# Patient Record
Sex: Female | Born: 1995
Health system: Southern US, Community
[De-identification: ages and names within clinical notes are randomized; demographics above are authoritative.]

## PROBLEM LIST (undated history)

## (undated) DIAGNOSIS — Z1589 Genetic susceptibility to other disease: Secondary | ICD-10-CM

## (undated) HISTORY — PX: WISDOM TOOTH EXTRACTION: SHX21

## (undated) HISTORY — DX: Genetic susceptibility to other disease: Z15.89

---
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
## 2015-11-04 ENCOUNTER — Encounter: Payer: Self-pay | Admitting: Certified Nurse Midwife

## 2015-11-04 ENCOUNTER — Ambulatory Visit (INDEPENDENT_AMBULATORY_CARE_PROVIDER_SITE_OTHER): Payer: Commercial Managed Care - PPO | Admitting: Certified Nurse Midwife

## 2015-11-04 VITALS — BP 104/70 | HR 68 | Resp 16 | Ht 62.25 in | Wt 109.0 lb

## 2015-11-04 DIAGNOSIS — N6310 Unspecified lump in the right breast, unspecified quadrant: Secondary | ICD-10-CM | POA: Diagnosis not present

## 2015-11-04 DIAGNOSIS — Z3041 Encounter for surveillance of contraceptive pills: Secondary | ICD-10-CM | POA: Diagnosis not present

## 2015-11-04 DIAGNOSIS — Z01419 Encounter for gynecological examination (general) (routine) without abnormal findings: Secondary | ICD-10-CM

## 2015-11-04 DIAGNOSIS — N926 Irregular menstruation, unspecified: Secondary | ICD-10-CM | POA: Diagnosis not present

## 2015-11-04 LAB — POCT URINE PREGNANCY: Preg Test, Ur: NEGATIVE

## 2015-11-04 MED ORDER — NORETHIN ACE-ETH ESTRAD-FE 1-20 MG-MCG PO TABS: ORAL_TABLET | ORAL | 4 refills | Status: DC

## 2015-11-04 MED ORDER — NORETHIN ACE-ETH ESTRAD-FE 1-20 MG-MCG(24) PO TABS: 1.0000 | ORAL_TABLET | Freq: Every day | ORAL | 4 refills | Status: DC

## 2015-11-04 NOTE — Patient Instructions (Signed)
General topics  Next pap or exam is  due in 1 year Take a Women's multivitamin Take 1200 mg. of calcium daily - prefer dietary If any concerns in interim to call back  Breast Self-Awareness Practicing breast self-awareness may pick up problems early, prevent significant medical complications, and possibly save your life. By practicing breast self-awareness, you can become familiar with how your breasts look and feel and if your breasts are changing. This allows you to notice changes early. It can also offer you some reassurance that your breast health is good. One way to learn what is normal for your breasts and whether your breasts are changing is to do a breast self-exam. If you find a lump or something that was not present in the past, it is best to contact your caregiver right away. Other findings that should be evaluated by your caregiver include nipple discharge, especially if it is bloody; skin changes or reddening; areas where the skin seems to be pulled in (retracted); or new lumps and bumps. Breast pain is seldom associated with cancer (malignancy), but should also be evaluated by a caregiver. BREAST SELF-EXAM The best time to examine your breasts is 5 7 days after your menstrual period is over.  ExitCare Patient Information 2013 [employer].   Exercise to Stay Healthy Exercise helps you become and stay healthy. EXERCISE IDEAS AND TIPS Choose exercises that:  You enjoy.  Fit into your day. You do not need to exercise really hard to be healthy. You can do exercises at a slow or medium level and stay healthy. You can:  Stretch before and after working out.  Try yoga, Pilates, or tai chi.  Lift weights.  Walk fast, swim, jog, run, climb stairs, bicycle, dance, or rollerskate.  Take aerobic classes. Exercises that burn about 150 calories:  Running 1  miles in 15 minutes.  Playing volleyball for 45 to 60 minutes.  Washing and waxing a car for 45 to 60  minutes.  Playing touch football for 45 minutes.  Walking 1  miles in 35 minutes.  Pushing a stroller 1  miles in 30 minutes.  Playing basketball for 30 minutes.  Raking leaves for 30 minutes.  Bicycling 5 miles in 30 minutes.  Walking 2 miles in 30 minutes.  Dancing for 30 minutes.  Shoveling snow for 15 minutes.  Swimming laps for 20 minutes.  Walking up stairs for 15 minutes.  Bicycling 4 miles in 15 minutes.  Gardening for 30 to 45 minutes.  Jumping rope for 15 minutes.  Washing windows or floors for 45 to 60 minutes. Document Released: 02/20/2010 Document Revised: 04/12/2011 Document Reviewed: 02/20/2010 ExitCare Patient Information 2013 [employer].   Other topics ( that may be useful information):    Sexually Transmitted Disease Sexually transmitted disease (STD) refers to any infection that is passed from person to person during sexual activity. This may happen by way of saliva, semen, blood, vaginal mucus, or urine. Common STDs include:  Gonorrhea.  Chlamydia.  Syphilis.  HIV/AIDS.  Genital herpes.  Hepatitis B and C.  Trichomonas.  Human papillomavirus (HPV).  Pubic lice. CAUSES  An STD may be spread by bacteria, virus, or parasite. A person can get an STD by:  Sexual intercourse with an infected person.  Sharing sex toys with an infected person.  Sharing needles with an infected person.  Having intimate contact with the genitals, mouth, or rectal areas of an infected person. SYMPTOMS  Some people may not have any symptoms, but   they can still pass the infection to others. Different STDs have different symptoms. Symptoms include:  Painful or bloody urination.  Pain in the pelvis, abdomen, vagina, anus, throat, or eyes.  Skin rash, itching, irritation, growths, or sores (lesions). These usually occur in the genital or anal area.  Abnormal vaginal discharge.  Penile discharge in men.  Soft, flesh-colored skin growths in the  genital or anal area.  Fever.  Pain or bleeding during sexual intercourse.  Swollen glands in the groin area.  Yellow skin and eyes (jaundice). This is seen with hepatitis. DIAGNOSIS  To make a diagnosis, your caregiver may:  Take a medical history.  Perform a physical exam.  Take a specimen (culture) to be examined.  Examine a sample of discharge under a microscope.  Perform blood test TREATMENT   Chlamydia, gonorrhea, trichomonas, and syphilis can be cured with antibiotic medicine.  Genital herpes, hepatitis, and HIV can be treated, but not cured, with prescribed medicines. The medicines will lessen the symptoms.  Genital warts from HPV can be treated with medicine or by freezing, burning (electrocautery), or surgery. Warts may come back.  HPV is a virus and cannot be cured with medicine or surgery.However, abnormal areas may be followed very closely by your caregiver and may be removed from the cervix, vagina, or vulva through office procedures or surgery. If your diagnosis is confirmed, your recent sexual partners need treatment. This is true even if they are symptom-free or have a negative culture or evaluation. They should not have sex until their caregiver says it is okay. HOME CARE INSTRUCTIONS  All sexual partners should be informed, tested, and treated for all STDs.  Take your antibiotics as directed. Finish them even if you start to feel better.  Only take over-the-counter or prescription medicines for pain, discomfort, or fever as directed by your caregiver.  Rest.  Eat a balanced diet and drink enough fluids to keep your urine clear or pale yellow.  Do not have sex until treatment is completed and you have followed up with your caregiver. STDs should be checked after treatment.  Keep all follow-up appointments, Pap tests, and blood tests as directed by your caregiver.  Only use latex condoms and water-soluble lubricants during sexual activity. Do not use  petroleum jelly or oils.  Avoid alcohol and illegal drugs.  Get vaccinated for HPV and hepatitis. If you have not received these vaccines in the past, talk to your caregiver about whether one or both might be right for you.  Avoid risky sex practices that can break the skin. The only way to avoid getting an STD is to avoid all sexual activity.Latex condoms and dental dams (for oral sex) will help lessen the risk of getting an STD, but will not completely eliminate the risk. SEEK MEDICAL CARE IF:   You have a fever.  You have any new or worsening symptoms. Document Released: 04/10/2002 Document Revised: 04/12/2011 Document Reviewed: 04/17/2010 [facility] Patient Information 2013 [employer].    Domestic Abuse You are being battered or abused if someone close to you hits, pushes, or physically hurts you in any way. You also are being abused if you are forced into activities. You are being sexually abused if you are forced to have sexual contact of any kind. You are being emotionally abused if you are made to feel worthless or if you are constantly threatened. It is important to remember that help is available. No one has the right to abuse you. PREVENTION OF FURTHER  ABUSE  Learn the warning signs of danger. This varies with situations but may include: the use of alcohol, threats, isolation from friends and family, or forced sexual contact. Leave if you feel that violence is going to occur.  If you are attacked or beaten, report it to the police so the abuse is documented. You do not have to press charges. The police can protect you while you or the attackers are leaving. Get the officer's name and badge number and a copy of the report.  Find someone you can trust and tell them what is happening to you: your caregiver, a nurse, clergy member, close friend or family member. Feeling ashamed is natural, but remember that you have done nothing wrong. No one deserves abuse. Document Released:  01/16/2000 Document Revised: 04/12/2011 Document Reviewed: 03/26/2010 ExitCare Patient Information 2013 [employer].    How Much is Too Much Alcohol? Drinking too much alcohol can cause injury, accidents, and health problems. These types of problems can include:   Car crashes.  Falls.  Family fighting (domestic violence).  Drowning.  Fights.  Injuries.  Burns.  Damage to certain organs.  Having a baby with birth defects. ONE DRINK CAN BE TOO MUCH WHEN YOU ARE:  Working.  Pregnant or breastfeeding.  Taking medicines. Ask your doctor.  Driving or planning to drive. If you or someone you know has a drinking problem, get help from a doctor.  Document Released: 11/14/2008 Document Revised: 04/12/2011 Document Reviewed: 11/14/2008 ExitCare Patient Information 2013 [employer].   Smoking Hazards Smoking cigarettes is extremely bad for your health. Tobacco smoke has over 200 known poisons in it. There are over 60 chemicals in tobacco smoke that cause cancer. Some of the chemicals found in cigarette smoke include:   Cyanide.  Benzene.  Formaldehyde.  Methanol (wood alcohol).  Acetylene (fuel used in welding torches).  Ammonia. Cigarette smoke also contains the poisonous gases nitrogen oxide and carbon monoxide.  Cigarette smokers have an increased risk of many serious medical problems and Smoking causes approximately:  90% of all lung cancer deaths in men.  80% of all lung cancer deaths in women.  90% of deaths from chronic obstructive lung disease. Compared with nonsmokers, smoking increases the risk of:  Coronary heart disease by 2 to 4 times.  Stroke by 2 to 4 times.  Men developing lung cancer by 23 times.  Women developing lung cancer by 13 times.  Dying from chronic obstructive lung diseases by 12 times.  . Smoking is the most preventable cause of death and disease in our society.  WHY IS SMOKING ADDICTIVE?  Nicotine is the chemical  agent in tobacco that is capable of causing addiction or dependence.  When you smoke and inhale, nicotine is absorbed rapidly into the bloodstream through your lungs. Nicotine absorbed through the lungs is capable of creating a powerful addiction. Both inhaled and non-inhaled nicotine may be addictive.  Addiction studies of cigarettes and spit tobacco show that addiction to nicotine occurs mainly during the teen years, when young people begin using tobacco products. WHAT ARE THE BENEFITS OF QUITTING?  There are many health benefits to quitting smoking.   Likelihood of developing cancer and heart disease decreases. Health improvements are seen almost immediately.  Blood pressure, pulse rate, and breathing patterns start returning to normal soon after quitting. QUITTING SMOKING   American Lung Association - 1-800-LUNGUSA  American Cancer Society - 1-800-ACS-2345 Document Released: 02/26/2004 Document Revised: 04/12/2011 Document Reviewed: 10/30/2008 ExitCare Patient Information 2013 ExitCare,   LLC.   Stress Management Stress is a state of physical or mental tension that often results from changes in your life or normal routine. Some common causes of stress are:  Death of a loved one.  Injuries or severe illnesses.  Getting fired or changing jobs.  Moving into a new home. Other causes may be:  Sexual problems.  Business or financial losses.  Taking on a large debt.  Regular conflict with someone at home or at work.  Constant tiredness from lack of sleep. It is not just bad things that are stressful. It may be stressful to:  Win the lottery.  Get married.  Buy a new car. The amount of stress that can be easily tolerated varies from person to person. Changes generally cause stress, regardless of the types of change. Too much stress can affect your health. It may lead to physical or emotional problems. Too little stress (boredom) may also become stressful. SUGGESTIONS TO  REDUCE STRESS:  Talk things over with your family and friends. It often is helpful to share your concerns and worries. If you feel your problem is serious, you may want to get help from a professional counselor.  Consider your problems one at a time instead of lumping them all together. Trying to take care of everything at once may seem impossible. List all the things you need to do and then start with the most important one. Set a goal to accomplish 2 or 3 things each day. If you expect to do too many in a single day you will naturally fail, causing you to feel even more stressed.  Do not use alcohol or drugs to relieve stress. Although you may feel better for a short time, they do not remove the problems that caused the stress. They can also be habit forming.  Exercise regularly - at least 3 times per week. Physical exercise can help to relieve that "uptight" feeling and will relax you.  The shortest distance between despair and hope is often a good night's sleep.  Go to bed and get up on time allowing yourself time for appointments without being rushed.  Take a short "time-out" period from any stressful situation that occurs during the day. Close your eyes and take some deep breaths. Starting with the muscles in your face, tense them, hold it for a few seconds, then relax. Repeat this with the muscles in your neck, shoulders, hand, stomach, back and legs.  Take good care of yourself. Eat a balanced diet and get plenty of rest.  Schedule time for having fun. Take a break from your daily routine to relax. HOME CARE INSTRUCTIONS   Call if you feel overwhelmed by your problems and feel you can no longer manage them on your own.  Return immediately if you feel like hurting yourself or someone else. Document Released: 07/14/2000 Document Revised: 04/12/2011 Document Reviewed: 03/06/2007 ExitCare Patient Information 2013 ExitCare[employer].   

## 2015-11-04 NOTE — Progress Notes (Signed)
Scheduled patient while in office for right breast ultrasound at the [facility] on 11/13/2015 at 9 am. Patient is agreeable to date and time. Placed in mammogram hold.

## 2015-11-04 NOTE — Progress Notes (Signed)
20 y.o. G0P0000 Single  Caucasian Fe here for annual exam. Contraception OCP. Periods normal, no issues. No partner change, no STD concerns or screening needed. Patient feels her OCP's are contributing to vaginal dryness on her off week. Would like to change but this dosage has worked well. No other health concerns today. Majoring in Speech pathology !  Patient's last menstrual period was 11/01/2015 (exact date).          Sexually active: Yes.    The current method of family planning is OCP (estrogen/progesterone).    Exercising: Yes.    cardio & weights Smoker:  no  Health Maintenance: Pap:  none MMG:  none Colonoscopy:  none BMD:   none TDaP:  2015 Shingles: no Pneumonia: no Hep C and HIV: not done Labs: upt-neg Self breast exam: not done   reports that she has never smoked. She has never used smokeless tobacco. She reports that she drinks about 3.0 oz of alcohol per week . She reports that she does not use drugs.  History reviewed. No pertinent past medical history.  History reviewed. No pertinent surgical history.  Current Outpatient Prescriptions  Medication Sig Dispense Refill  . norethindrone-ethinyl estradiol (MICROGESTIN FE 1/20) 1-20 MG-MCG tablet TAKE ONE TABLET BY MOUTH EVERY DAY 3 Package 4   No current facility-administered medications for this visit.     Family History  Problem Relation Age of Onset  . Diabetes Father   . Heart attack Father   . Hyperlipidemia Father   . Hypertension Father   . Hypertension Maternal Grandmother   . Hyperlipidemia Maternal Grandmother   . Diabetes Maternal Grandmother     ROS:  Pertinent items are noted in HPI.  Otherwise, a comprehensive ROS was negative.  Exam:   BP 104/70   Pulse 68   Resp 16   Ht 5' 2.25" (1.581 m)   Wt 109 lb (49.4 kg)   LMP 11/01/2015 (Exact Date)   BMI 19.78 kg/m  Height: 5' 2.25" (158.1 cm) Ht Readings from Last 3 Encounters:  11/04/15 5' 2.25" (1.581 m)  09/27/14 5' 1.75" (1.568 m) (16  %, Z= -0.99)*  09/14/13 5\' 2"  (1.575 m) (19 %, Z= -0.88)*   * Growth percentiles are based on CDC 2-20 Years data.    General appearance: alert, cooperative and appears stated age Head: Normocephalic, without obvious abnormality, atraumatic Neck: no adenopathy, supple, symmetrical, trachea midline and thyroid normal to inspection and palpation Lungs: clear to auscultation bilaterally Breasts: normal appearance, no masses or tenderness, No nipple retraction or dimpling, No nipple discharge or bleeding, No axillary or supraclavicular adenopathy, positive findings: right breast at 9 o'clock position mobile pea size cluster, non tender, no skin change or nipple discharge @ aerola edge Heart: regular rate and rhythm Abdomen: soft, non-tender; no masses,  no organomegaly Extremities: extremities normal, atraumatic, no cyanosis or edema Skin: Skin color, texture, turgor normal. No rashes or lesions Lymph nodes: Cervical, supraclavicular, and axillary nodes normal. No abnormal inguinal nodes palpated Neurologic: Grossly normal   Pelvic: External genitalia:  no lesions              Urethra:  normal appearing urethra with no masses, tenderness or lesions              Bartholin's and Skene's: normal                 Vagina: normal appearing vagina with normal color and discharge, no lesions  Cervix: no cervical motion tenderness, no lesions and nulliparous appearance              Pap taken: No. Bimanual Exam:  Uterus:  normal size, contour, position, consistency, mobility, non-tender              Adnexa: normal adnexa and no mass, fullness, tenderness               Rectovaginal: Confirms               Anus:  normal  Chaperone present: yes  A:  Well Woman with normal exam  Contraception OCP change desired due to vaginal dryness  Right breast mass.  P:   Reviewed health and wellness pertinent to exam  Discussed increasing Estrogen length in OC cycle may help without changing  dosage(which the patient would prefer). Patient agreeable and will start once she finishes this pack. Discussed may change period profile and patient OK with that. Also discussed coconut oil use for vaginal moisture with sexual activity. Questions addressed. Will advise if no change.  Rx Loestrin 24 Fe see order with instructions  Discussed right breast finding and patient also palpated area in breast. Discussed need to evaluate with [place]. Discussed possible etiology of cyst period related, or fibroadenoma. Questions addressed. Patient will be scheduled prior to leaving today.  Pap smear as above not taken   counseled on breast self exam, STD prevention, HIV risk factors and prevention, use and side effects of OCP's, adequate intake of calcium and vitamin D, diet and exercise  return annually or prn  An After Visit Summary was printed and given to the patient.

## 2015-11-05 NOTE — Progress Notes (Signed)
Encounter reviewed Randye Treichler, MD   

## 2015-11-13 ENCOUNTER — Ambulatory Visit
Admission: RE | Admit: 2015-11-13 | Discharge: 2015-11-13 | Disposition: A | Discharge status: Home / Self Care | Payer: Commercial Managed Care - PPO | Source: Ambulatory Visit | Attending: Certified Nurse Midwife | Admitting: Certified Nurse Midwife

## 2015-11-13 DIAGNOSIS — N6310 Unspecified lump in the right breast, unspecified quadrant: Secondary | ICD-10-CM

## 2015-11-14 ENCOUNTER — Telehealth: Payer: Self-pay

## 2015-11-14 NOTE — Telephone Encounter (Signed)
Left message to call [facility] at [phone]. Patient needs a 6 week recheck appointment with [name] CNM.  Notes Recorded by Jasmine Awe, RN on 11/14/2015 at 10:45 AM EDT Continued mammogram hold. ------  Notes Recorded by Salvadore Dom, MD on 11/13/2015 at 12:46 PM EDT Keep on mammogram hold until her f/u imaging in 6 months and make sure she has a f/u appointment with Jackelyn Poling for a breast check 6 weeks after her last exam.

## 2015-11-14 NOTE — Telephone Encounter (Signed)
Spoke with patient. 6 week breast recheck appointment scheduled for 12/17/2015 at 2:45 pm with [name] CNM. Patient is agreeable to date and time.  Routing to provider for final review. Patient agreeable to disposition. Will close encounter.

## 2015-12-17 ENCOUNTER — Ambulatory Visit (INDEPENDENT_AMBULATORY_CARE_PROVIDER_SITE_OTHER): Payer: Commercial Managed Care - PPO | Admitting: Certified Nurse Midwife

## 2015-12-17 ENCOUNTER — Encounter: Payer: Self-pay | Admitting: Certified Nurse Midwife

## 2015-12-17 VITALS — BP 104/70 | HR 68 | Resp 16 | Ht 62.25 in | Wt 113.0 lb

## 2015-12-17 DIAGNOSIS — D241 Benign neoplasm of right breast: Secondary | ICD-10-CM

## 2015-12-17 NOTE — Progress Notes (Signed)
   Subjective:   20 y.o. Single Caucasian female presents for recheck of right breast mass. Patient was noted to have right breast mass at aex on 11/04/15. She was seen for [place] on 11/13/15. Findings were probable fibroadenoma, benign appearance and follow up exam in 6 months. Patient does not feel it has changed in size, non tender. Does SBE. No health issues today.  Review of Systems Pertinent items are noted in HPI.   Objective:   General appearance: alert, cooperative, appears stated age and no distress Breasts: normal appearance, no masses or tenderness, No nipple retraction or dimpling, No nipple discharge or bleeding, No axillary or supraclavicular adenopathy, positive findings: right breast at 8-9 o'clock 2 cm mobile breast mass, non tender noted which corresponds to previous finding and [place] report.  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 Plan:   PLAN: The patient has a documented plan to follow up  breast mass with follow up US in 6 months. Patient instructed to do monthly SBE and to advise if she feels there is a change in size or tenderness. Agreeable  Rv prn/aex

## 2015-12-17 NOTE — Patient Instructions (Signed)
Breast Self-Awareness Introduction Breast self-awareness means:  Knowing how your breasts look.  Knowing how your breasts feel.  Checking your breasts every month for changes.  Telling your doctor if you notice a change in your breasts. Breast self-awareness allows you to notice a breast problem early while it is still small. How to do a breast self-exam One way to learn what is normal for your breasts and to check for changes is to do a breast self-exam. To do a breast self-exam: Look for Changes  1. Take off all the clothes above your waist. 2. Stand in front of a mirror in a room with good lighting. 3. Put your hands on your hips. 4. Push your hands down. 5. Look at your breasts and nipples in the mirror to see if one breast or nipple looks different than the other. Check to see if:  The shape of one breast is different.  The size of one breast is different.  There are wrinkles, dips, and bumps in one breast and not the other. 6. Look at each breast for changes in your skin, such as:  Redness.  Scaly areas. 7. Look for changes in your nipples, such as:  Liquid around the nipples.  Bleeding.  Dimpling.  Redness.  A change in where the nipples are. Feel for Changes 1. Lie on your back on the floor. 2. Feel each breast. To do this, follow these steps:  Pick a breast to feel.  Put the arm closest to that breast above your head.  Use your other arm to feel the nipple area of your breast. Feel the area with the pads of your three middle fingers by making small circles with your fingers. For the first circle, press lightly. For the second circle, press harder. For the third circle, press even harder.  Keep making circles with your fingers at the light, harder, and even harder pressures as you move down your breast. Stop when you feel your ribs.  Move your fingers a little toward the center of your body.  Start making circles with your fingers again, this time going  up until you reach your collarbone.  Keep making up and down circles until you reach your armpit. Remember to keep using the three pressures.  Feel the other breast in the same way. 3. Sit or stand in the shower or tub. 4. With soapy water on your skin, feel each breast the same way you did in step 2, when you were lying on the floor. Write Down What You Find  After doing the self-exam, write down:  What is normal for each breast.  Any changes you find in each breast.  When you last had your period. How often should I check my breasts? Check your breasts every month. If you are breastfeeding, the best time to check them is after you feed your baby or after you use a breast pump. If you get periods, the best time to check your breasts is 5-7 days after your period is over. When should I see my doctor? See your doctor if you notice:  A change in shape or size of your breasts or nipples.  A change in the skin of your breast or nipples, such as red or scaly skin.  Unusual fluid coming from your nipples.  A lump or thick area that was not there before.  Pain in your breasts.  Anything that concerns you. This information is not intended to replace advice given to you by  your health care provider. Make sure you discuss any questions you have with your health care provider. Document Released: 07/07/2007 Document Revised: 06/26/2015 Document Reviewed: 12/08/2014  2017 [employer]

## 2015-12-23 NOTE — Progress Notes (Signed)
Encounter reviewed Sahiba Granholm, MD   

## 2016-12-14 ENCOUNTER — Ambulatory Visit: Payer: PRIVATE HEALTH INSURANCE | Admitting: Certified Nurse Midwife

## 2016-12-14 ENCOUNTER — Encounter: Payer: Self-pay | Admitting: Certified Nurse Midwife

## 2016-12-14 ENCOUNTER — Other Ambulatory Visit (HOSPITAL_COMMUNITY)
Admission: RE | Admit: 2016-12-14 | Discharge: 2016-12-14 | Disposition: A | Discharge status: Home / Self Care | Payer: PRIVATE HEALTH INSURANCE | Source: Ambulatory Visit | Attending: Obstetrics & Gynecology | Admitting: Obstetrics & Gynecology

## 2016-12-14 ENCOUNTER — Other Ambulatory Visit: Payer: Self-pay

## 2016-12-14 VITALS — BP 112/72 | HR 70 | Resp 16 | Ht 61.75 in | Wt 123.0 lb

## 2016-12-14 DIAGNOSIS — Z124 Encounter for screening for malignant neoplasm of cervix: Secondary | ICD-10-CM | POA: Insufficient documentation

## 2016-12-14 DIAGNOSIS — Z01419 Encounter for gynecological examination (general) (routine) without abnormal findings: Secondary | ICD-10-CM | POA: Diagnosis not present

## 2016-12-14 DIAGNOSIS — Z3041 Encounter for surveillance of contraceptive pills: Secondary | ICD-10-CM | POA: Diagnosis not present

## 2016-12-14 DIAGNOSIS — D241 Benign neoplasm of right breast: Secondary | ICD-10-CM | POA: Diagnosis not present

## 2016-12-14 MED ORDER — NORETHIN ACE-ETH ESTRAD-FE 1-20 MG-MCG(24) PO TABS: 1.0000 | ORAL_TABLET | Freq: Every day | ORAL | 4 refills | Status: DC

## 2016-12-14 NOTE — Progress Notes (Signed)
21 y.o. G0P0000 Single  Caucasian Fe here for annual exam. Periods normal no issues.Contraception working well, desires continuance. No partner change,no STD concerns. Last year of college, will graduate in spring and then plans graduate school. Working as a Surveyor, minerals to help with expenses. 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 No other health issues today. May not be able to continue here due to change in insurance, will advise. Mother patient here and may have same issues. Took vacation at [employer] !  Patient's last menstrual period was 12/05/2016 (exact date).          Sexually active: Yes.    The current method of family planning is OCP (estrogen/progesterone).    Exercising: Yes.    walk Smoker:  no  Health Maintenance: Pap:  none History of Abnormal Pap: no MMG:  11-13-15 rt breast u/s birads 3: prob benign short interval f/u suggested Self Breast exams: yes Colonoscopy:  none BMD:   none TDaP:  2015 Shingles: no Pneumonia: no Hep C and HIV: not done Labs: if needed.   reports that  has never smoked. she has never used smokeless tobacco. She reports that she drinks about 1.8 oz of alcohol per week. She reports that she does not use drugs.  History reviewed. No pertinent past medical history.  History reviewed. No pertinent surgical history.  Current Outpatient Medications  Medication Sig Dispense Refill  . Norethindrone Acetate-Ethinyl Estrad-FE (LOESTRIN 24 FE) 1-20 MG-MCG(24) tablet Take 1 tablet by mouth daily. 3 Package 4   No current facility-administered medications for this visit.     Family History  Problem Relation Age of Onset  . Diabetes Father   . Heart attack Father   . Hyperlipidemia Father   . Hypertension Father   . Hypertension Maternal Grandmother   . Hyperlipidemia Maternal Grandmother   . Diabetes Maternal Grandmother     ROS:  Pertinent items are noted in HPI.  Otherwise, a comprehensive ROS  was negative.  Exam:   BP 112/72   Pulse 70   Resp 16   Ht 5' 1.75" (1.568 m)   Wt 123 lb (55.8 kg)   LMP 12/05/2016 (Exact Date)   BMI 22.68 kg/m  Height: 5' 1.75" (156.8 cm) Ht Readings from Last 3 Encounters:  12/14/16 5' 1.75" (1.568 m)  12/17/15 5' 2.25" (1.581 m)  11/04/15 5' 2.25" (1.581 m)    General appearance: alert, cooperative and appears stated age Head: Normocephalic, without obvious abnormality, atraumatic Neck: no adenopathy, supple, symmetrical, trachea midline and thyroid normal to inspection and palpation Lungs: clear to auscultation bilaterally Breasts: normal appearance, no masses or tenderness, No nipple retraction or dimpling, No nipple discharge or bleeding, No axillary or supraclavicular adenopathy. Right breast at 9-10 o'clock fibroadenoma noted Heart: regular rate and rhythm Abdomen: soft, non-tender; no masses,  no organomegaly Extremities: extremities normal, atraumatic, no cyanosis or edema Skin: Skin color, texture, turgor normal. No rashes or lesions Lymph nodes: Cervical, supraclavicular, and axillary nodes normal. No abnormal inguinal nodes palpated Neurologic: Grossly normal   Pelvic: External genitalia:  no lesions              Urethra:  normal appearing urethra with no masses, tenderness or lesions              Bartholin's and Skene's: normal                 Vagina: normal appearing vagina with  normal color and discharge, no lesions              Cervix: no cervical motion tenderness, no lesions and nulliparous appearance              Pap taken: Yes.   Bimanual Exam:  Uterus:  normal size, contour, position, consistency, mobility, non-tender              Adnexa: normal adnexa and no mass, fullness, tenderness               Rectovaginal: Confirms               Anus:  normal sphincter tone, no lesions  Chaperone present: yes  A:  Well Woman with normal exam  Contraception OCP desired  History of benign finding of fibroadenoma of right  breast , no change with exam  First pap smear today  P:   Reviewed health and wellness pertinent to exam  Rx Loestrin 24 Fe see order with instructions  Stressed SBE and need to come in if any change  Pap smear: yes   counseled on breast self exam, STD prevention, HIV risk factors and prevention, use and side effects of OCP's, adequate intake of calcium and vitamin D, diet and exercise  return annually or prn  An After Visit Summary was printed and given to the patient.

## 2016-12-14 NOTE — Patient Instructions (Signed)
General topics  Next pap or exam is  due in 1 year Take a Women's multivitamin Take 1200 mg. of calcium daily - prefer dietary If any concerns in interim to call back  Breast Self-Awareness Practicing breast self-awareness may pick up problems early, prevent significant medical complications, and possibly save your life. By practicing breast self-awareness, you can become familiar with how your breasts look and feel and if your breasts are changing. This allows you to notice changes early. It can also offer you some reassurance that your breast health is good. One way to learn what is normal for your breasts and whether your breasts are changing is to do a breast self-exam. If you find a lump or something that was not present in the past, it is best to contact your caregiver right away. Other findings that should be evaluated by your caregiver include nipple discharge, especially if it is bloody; skin changes or reddening; areas where the skin seems to be pulled in (retracted); or new lumps and bumps. Breast pain is seldom associated with cancer (malignancy), but should also be evaluated by a caregiver. BREAST SELF-EXAM The best time to examine your breasts is 5 7 days after your menstrual period is over.  ExitCare Patient Information 2013 [employer].   Exercise to Stay Healthy Exercise helps you become and stay healthy. EXERCISE IDEAS AND TIPS Choose exercises that:  You enjoy.  Fit into your day. You do not need to exercise really hard to be healthy. You can do exercises at a slow or medium level and stay healthy. You can:  Stretch before and after working out.  Try yoga, Pilates, or tai chi.  Lift weights.  Walk fast, swim, jog, run, climb stairs, bicycle, dance, or rollerskate.  Take aerobic classes. Exercises that burn about 150 calories:  Running 1  miles in 15 minutes.  Playing volleyball for 45 to 60 minutes.  Washing and waxing a car for 45 to 60  minutes.  Playing touch football for 45 minutes.  Walking 1  miles in 35 minutes.  Pushing a stroller 1  miles in 30 minutes.  Playing basketball for 30 minutes.  Raking leaves for 30 minutes.  Bicycling 5 miles in 30 minutes.  Walking 2 miles in 30 minutes.  Dancing for 30 minutes.  Shoveling snow for 15 minutes.  Swimming laps for 20 minutes.  Walking up stairs for 15 minutes.  Bicycling 4 miles in 15 minutes.  Gardening for 30 to 45 minutes.  Jumping rope for 15 minutes.  Washing windows or floors for 45 to 60 minutes. Document Released: 02/20/2010 Document Revised: 04/12/2011 Document Reviewed: 02/20/2010 Exit[employer] Patient Information 2013 [employer].   Other topics ( that may be useful information):    Sexually Transmitted Disease Sexually transmitted disease (STD) refers to any infection that is passed from person to person during sexual activity. This may happen by way of saliva, semen, blood, vaginal mucus, or urine. Common STDs include:  Gonorrhea.  Chlamydia.  Syphilis.  HIV/AIDS.  Genital herpes.  Hepatitis B and C.  Trichomonas.  Human papillomavirus (HPV).  Pubic lice. CAUSES  An STD may be spread by bacteria, virus, or parasite. A person can get an STD by:  Sexual intercourse with an infected person.  Sharing sex toys with an infected person.  Sharing needles with an infected person.  Having intimate contact with the genitals, mouth, or rectal areas of an infected person. SYMPTOMS  Some people may not have any symptoms, but   they can still pass the infection to others. Different STDs have different symptoms. Symptoms include:  Painful or bloody urination.  Pain in the pelvis, abdomen, vagina, anus, throat, or eyes.  Skin rash, itching, irritation, growths, or sores (lesions). These usually occur in the genital or anal area.  Abnormal vaginal discharge.  Penile discharge in men.  Soft, flesh-colored skin growths in the  genital or anal area.  Fever.  Pain or bleeding during sexual intercourse.  Swollen glands in the groin area.  Yellow skin and eyes (jaundice). This is seen with hepatitis. DIAGNOSIS  To make a diagnosis, your caregiver may:  Take a medical history.  Perform a physical exam.  Take a specimen (culture) to be examined.  Examine a sample of discharge under a microscope.  Perform blood test TREATMENT   Chlamydia, gonorrhea, trichomonas, and syphilis can be cured with antibiotic medicine.  Genital herpes, hepatitis, and HIV can be treated, but not cured, with prescribed medicines. The medicines will lessen the symptoms.  Genital warts from HPV can be treated with medicine or by freezing, burning (electrocautery), or surgery. Warts may come back.  HPV is a virus and cannot be cured with medicine or surgery.However, abnormal areas may be followed very closely by your caregiver and may be removed from the cervix, vagina, or vulva through office procedures or surgery. If your diagnosis is confirmed, your recent sexual partners need treatment. This is true even if they are symptom-free or have a negative culture or evaluation. They should not have sex until their caregiver says it is okay. HOME CARE INSTRUCTIONS  All sexual partners should be informed, tested, and treated for all STDs.  Take your antibiotics as directed. Finish them even if you start to feel better.  Only take over-the-counter or prescription medicines for pain, discomfort, or fever as directed by your caregiver.  Rest.  Eat a balanced diet and drink enough fluids to keep your urine clear or pale yellow.  Do not have sex until treatment is completed and you have followed up with your caregiver. STDs should be checked after treatment.  Keep all follow-up appointments, Pap tests, and blood tests as directed by your caregiver.  Only use latex condoms and water-soluble lubricants during sexual activity. Do not use  petroleum jelly or oils.  Avoid alcohol and illegal drugs.  Get vaccinated for HPV and hepatitis. If you have not received these vaccines in the past, talk to your caregiver about whether one or both might be right for you.  Avoid risky sex practices that can break the skin. The only way to avoid getting an STD is to avoid all sexual activity.Latex condoms and dental dams (for oral sex) will help lessen the risk of getting an STD, but will not completely eliminate the risk. SEEK MEDICAL CARE IF:   You have a fever.  You have any new or worsening symptoms. Document Released: 04/10/2002 Document Revised: 04/12/2011 Document Reviewed: 04/17/2010 [facility] Patient Information 2013 [employer].    Domestic Abuse You are being battered or abused if someone close to you hits, pushes, or physically hurts you in any way. You also are being abused if you are forced into activities. You are being sexually abused if you are forced to have sexual contact of any kind. You are being emotionally abused if you are made to feel worthless or if you are constantly threatened. It is important to remember that help is available. No one has the right to abuse you. PREVENTION OF FURTHER  ABUSE  Learn the warning signs of danger. This varies with situations but may include: the use of alcohol, threats, isolation from friends and family, or forced sexual contact. Leave if you feel that violence is going to occur.  If you are attacked or beaten, report it to the police so the abuse is documented. You do not have to press charges. The police can protect you while you or the attackers are leaving. Get the officer's name and badge number and a copy of the report.  Find someone you can trust and tell them what is happening to you: your caregiver, a nurse, clergy member, close friend or family member. Feeling ashamed is natural, but remember that you have done nothing wrong. No one deserves abuse. Document Released:  01/16/2000 Document Revised: 04/12/2011 Document Reviewed: 03/26/2010 ExitCare Patient Information 2013 [employer].    How Much is Too Much Alcohol? Drinking too much alcohol can cause injury, accidents, and health problems. These types of problems can include:   Car crashes.  Falls.  Family fighting (domestic violence).  Drowning.  Fights.  Injuries.  Burns.  Damage to certain organs.  Having a baby with birth defects. ONE DRINK CAN BE TOO MUCH WHEN YOU ARE:  Working.  Pregnant or breastfeeding.  Taking medicines. Ask your doctor.  Driving or planning to drive. If you or someone you know has a drinking problem, get help from a doctor.  Document Released: 11/14/2008 Document Revised: 04/12/2011 Document Reviewed: 11/14/2008 ExitCare Patient Information 2013 [employer].   Smoking Hazards Smoking cigarettes is extremely bad for your health. Tobacco smoke has over 200 known poisons in it. There are over 60 chemicals in tobacco smoke that cause cancer. Some of the chemicals found in cigarette smoke include:   Cyanide.  Benzene.  Formaldehyde.  Methanol (wood alcohol).  Acetylene (fuel used in welding torches).  Ammonia. Cigarette smoke also contains the poisonous gases nitrogen oxide and carbon monoxide.  Cigarette smokers have an increased risk of many serious medical problems and Smoking causes approximately:  90% of all lung cancer deaths in men.  80% of all lung cancer deaths in women.  90% of deaths from chronic obstructive lung disease. Compared with nonsmokers, smoking increases the risk of:  Coronary heart disease by 2 to 4 times.  Stroke by 2 to 4 times.  Men developing lung cancer by 23 times.  Women developing lung cancer by 13 times.  Dying from chronic obstructive lung diseases by 12 times.  . Smoking is the most preventable cause of death and disease in our society.  WHY IS SMOKING ADDICTIVE?  Nicotine is the chemical  agent in tobacco that is capable of causing addiction or dependence.  When you smoke and inhale, nicotine is absorbed rapidly into the bloodstream through your lungs. Nicotine absorbed through the lungs is capable of creating a powerful addiction. Both inhaled and non-inhaled nicotine may be addictive.  Addiction studies of cigarettes and spit tobacco show that addiction to nicotine occurs mainly during the teen years, when young people begin using tobacco products. WHAT ARE THE BENEFITS OF QUITTING?  There are many health benefits to quitting smoking.   Likelihood of developing cancer and heart disease decreases. Health improvements are seen almost immediately.  Blood pressure, pulse rate, and breathing patterns start returning to normal soon after quitting. QUITTING SMOKING   American Lung Association - 1-800-LUNGUSA  American Cancer Society - 1-800-ACS-2345 Document Released: 02/26/2004 Document Revised: 04/12/2011 Document Reviewed: 10/30/2008 ExitCare Patient Information 2013 [employer].   Stress Management Stress is a state of physical or mental tension that often results from changes in your life or normal routine. Some common causes of stress are:  Death of a loved one.  Injuries or severe illnesses.  Getting fired or changing jobs.  Moving into a new home. Other causes may be:  Sexual problems.  Business or financial losses.  Taking on a large debt.  Regular conflict with someone at home or at work.  Constant tiredness from lack of sleep. It is not just bad things that are stressful. It may be stressful to:  Win the lottery.  Get married.  Buy a new car. The amount of stress that can be easily tolerated varies from person to person. Changes generally cause stress, regardless of the types of change. Too much stress can affect your health. It may lead to physical or emotional problems. Too little stress (boredom) may also become stressful. SUGGESTIONS TO  REDUCE STRESS:  Talk things over with your family and friends. It often is helpful to share your concerns and worries. If you feel your problem is serious, you may want to get help from a professional counselor.  Consider your problems one at a time instead of lumping them all together. Trying to take care of everything at once may seem impossible. List all the things you need to do and then start with the most important one. Set a goal to accomplish 2 or 3 things each day. If you expect to do too many in a single day you will naturally fail, causing you to feel even more stressed.  Do not use alcohol or drugs to relieve stress. Although you may feel better for a short time, they do not remove the problems that caused the stress. They can also be habit forming.  Exercise regularly - at least 3 times per week. Physical exercise can help to relieve that "uptight" feeling and will relax you.  The shortest distance between despair and hope is often a good night's sleep.  Go to bed and get up on time allowing yourself time for appointments without being rushed.  Take a short "time-out" period from any stressful situation that occurs during the day. Close your eyes and take some deep breaths. Starting with the muscles in your face, tense them, hold it for a few seconds, then relax. Repeat this with the muscles in your neck, shoulders, hand, stomach, back and legs.  Take good care of yourself. Eat a balanced diet and get plenty of rest.  Schedule time for having fun. Take a break from your daily routine to relax. HOME CARE INSTRUCTIONS   Call if you feel overwhelmed by your problems and feel you can no longer manage them on your own.  Return immediately if you feel like hurting yourself or someone else. Document Released: 07/14/2000 Document Revised: 04/12/2011 Document Reviewed: 03/06/2007 [facility] Patient Information 2013 [employer].   Oral Contraception Use Oral contraceptive pills  (OCPs) are medicines taken to prevent pregnancy. OCPs work by preventing the ovaries from releasing eggs. The hormones in OCPs also cause the cervical mucus to thicken, preventing the sperm from entering the uterus. The hormones also cause the uterine lining to become thin, not allowing a fertilized egg to attach to the inside of the uterus. OCPs are highly effective when taken exactly as prescribed. However, OCPs do not prevent sexually transmitted diseases (STDs). Safe sex practices, such as using condoms along with an OCP, can help prevent STDs.  Before taking OCPs, you may have a physical exam and Pap test. Your health care provider may also order blood tests if necessary. Your health care provider will make sure you are a good candidate for oral contraception. Discuss with your health care provider the possible side effects of the OCP you may be prescribed. When starting an OCP, it can take 2 to 3 months for the body to adjust to the changes in hormone levels in your body. How to take oral contraceptive pills Your health care provider may advise you on how to start taking the first cycle of OCPs. Otherwise, you can:  Start on day 1 of your menstrual period. You will not need any backup contraceptive protection with this start time.  Start on the first Sunday after your menstrual period or the day you get your prescription. In these cases, you will need to use backup contraceptive protection for the first week.  Start the pill at any time of your cycle. If you take the pill within 5 days of the start of your period, you are protected against pregnancy right away. In this case, you will not need a backup form of birth control. If you start at any other time of your menstrual cycle, you will need to use another form of birth control for 7 days. If your OCP is the type called a minipill, it will protect you from pregnancy after taking it for 2 days (48 hours).  After you have started taking OCPs:  If  you forget to take 1 pill, take it as soon as you remember. Take the next pill at the regular time.  If you miss 2 or more pills, call your health care provider because different pills have different instructions for missed doses. Use backup birth control until your next menstrual period starts.  If you use a 28-day pack that contains inactive pills and you miss 1 of the last 7 pills (pills with no hormones), it will not matter. Throw away the rest of the non-hormone pills and start a new pill pack.  No matter which day you start the OCP, you will always start a new pack on that same day of the week. Have an extra pack of OCPs and a backup contraceptive method available in case you miss some pills or lose your OCP pack. Follow these instructions at home:  Do not smoke.  Always use a condom to protect against STDs. OCPs do not protect against STDs.  Use a calendar to mark your menstrual period days.  Read the information and directions that came with your OCP. Talk to your health care provider if you have questions. Contact a health care provider if:  You develop nausea and vomiting.  You have abnormal vaginal discharge or bleeding.  You develop a rash.  You miss your menstrual period.  You are losing your hair.  You need treatment for mood swings or depression.  You get dizzy when taking the OCP.  You develop acne from taking the OCP.  You become pregnant. Get help right away if:  You develop chest pain.  You develop shortness of breath.  You have an uncontrolled or severe headache.  You develop numbness or slurred speech.  You develop visual problems.  You develop pain, redness, and swelling in the legs. This information is not intended to replace advice given to you by your health care provider. Make sure you discuss any questions you have with your health care provider. Document Released: 01/07/2011  Document Revised: 06/26/2015 Document Reviewed:  07/09/2012 [employer] Interactive Patient Education  2017 [employer]  

## 2016-12-15 LAB — CYTOLOGY - PAP: Diagnosis: NEGATIVE

## 2017-12-16 ENCOUNTER — Other Ambulatory Visit: Payer: Self-pay

## 2017-12-16 ENCOUNTER — Encounter: Payer: Self-pay | Admitting: Certified Nurse Midwife

## 2017-12-16 ENCOUNTER — Ambulatory Visit: Payer: PRIVATE HEALTH INSURANCE | Admitting: Certified Nurse Midwife

## 2017-12-16 VITALS — BP 110/68 | HR 60 | Resp 16 | Ht 61.75 in | Wt 113.0 lb

## 2017-12-16 DIAGNOSIS — Z3041 Encounter for surveillance of contraceptive pills: Secondary | ICD-10-CM | POA: Diagnosis not present

## 2017-12-16 DIAGNOSIS — Z01419 Encounter for gynecological examination (general) (routine) without abnormal findings: Secondary | ICD-10-CM | POA: Diagnosis not present

## 2017-12-16 MED ORDER — NORETHIN ACE-ETH ESTRAD-FE 1-20 MG-MCG(24) PO TABS: 1.0000 | ORAL_TABLET | Freq: Every day | ORAL | 4 refills | Status: DC

## 2017-12-16 NOTE — Patient Instructions (Signed)
General topics  Next pap or exam is  due in 1 year Take a Women's multivitamin Take 1200 mg. of calcium daily - prefer dietary If any concerns in interim to call back  Breast Self-Awareness Practicing breast self-awareness may pick up problems early, prevent significant medical complications, and possibly save your life. By practicing breast self-awareness, you can become familiar with how your breasts look and feel and if your breasts are changing. This allows you to notice changes early. It can also offer you some reassurance that your breast health is good. One way to learn what is normal for your breasts and whether your breasts are changing is to do a breast self-exam. If you find a lump or something that was not present in the past, it is best to contact your caregiver right away. Other findings that should be evaluated by your caregiver include nipple discharge, especially if it is bloody; skin changes or reddening; areas where the skin seems to be pulled in (retracted); or new lumps and bumps. Breast pain is seldom associated with cancer (malignancy), but should also be evaluated by a caregiver. BREAST SELF-EXAM The best time to examine your breasts is 5 7 days after your menstrual period is over.  ExitCare Patient Information 2013 [employer].   Exercise to Stay Healthy Exercise helps you become and stay healthy. EXERCISE IDEAS AND TIPS Choose exercises that:  You enjoy.  Fit into your day. You do not need to exercise really hard to be healthy. You can do exercises at a slow or medium level and stay healthy. You can:  Stretch before and after working out.  Try yoga, Pilates, or tai chi.  Lift weights.  Walk fast, swim, jog, run, climb stairs, bicycle, dance, or rollerskate.  Take aerobic classes. Exercises that burn about 150 calories:  Running 1  miles in 15 minutes.  Playing volleyball for 45 to 60 minutes.  Washing and waxing a car for 45 to 60  minutes.  Playing touch football for 45 minutes.  Walking 1  miles in 35 minutes.  Pushing a stroller 1  miles in 30 minutes.  Playing basketball for 30 minutes.  Raking leaves for 30 minutes.  Bicycling 5 miles in 30 minutes.  Walking 2 miles in 30 minutes.  Dancing for 30 minutes.  Shoveling snow for 15 minutes.  Swimming laps for 20 minutes.  Walking up stairs for 15 minutes.  Bicycling 4 miles in 15 minutes.  Gardening for 30 to 45 minutes.  Jumping rope for 15 minutes.  Washing windows or floors for 45 to 60 minutes. Document Released: 02/20/2010 Document Revised: 04/12/2011 Document Reviewed: 02/20/2010 ExitCare Patient Information 2013 Exit[employer].   Other topics ( that may be useful information):    Sexually Transmitted Disease Sexually transmitted disease (STD) refers to any infection that is passed from person to person during sexual activity. This may happen by way of saliva, semen, blood, vaginal mucus, or urine. Common STDs include:  Gonorrhea.  Chlamydia.  Syphilis.  HIV/AIDS.  Genital herpes.  Hepatitis B and C.  Trichomonas.  Human papillomavirus (HPV).  Pubic lice. CAUSES  An STD may be spread by bacteria, virus, or parasite. A person can get an STD by:  Sexual intercourse with an infected person.  Sharing sex toys with an infected person.  Sharing needles with an infected person.  Having intimate contact with the genitals, mouth, or rectal areas of an infected person. SYMPTOMS  Some people may not have any symptoms, but   they can still pass the infection to others. Different STDs have different symptoms. Symptoms include:  Painful or bloody urination.  Pain in the pelvis, abdomen, vagina, anus, throat, or eyes.  Skin rash, itching, irritation, growths, or sores (lesions). These usually occur in the genital or anal area.  Abnormal vaginal discharge.  Penile discharge in men.  Soft, flesh-colored skin growths in the  genital or anal area.  Fever.  Pain or bleeding during sexual intercourse.  Swollen glands in the groin area.  Yellow skin and eyes (jaundice). This is seen with hepatitis. DIAGNOSIS  To make a diagnosis, your caregiver may:  Take a medical history.  Perform a physical exam.  Take a specimen (culture) to be examined.  Examine a sample of discharge under a microscope.  Perform blood test TREATMENT   Chlamydia, gonorrhea, trichomonas, and syphilis can be cured with antibiotic medicine.  Genital herpes, hepatitis, and HIV can be treated, but not cured, with prescribed medicines. The medicines will lessen the symptoms.  Genital warts from HPV can be treated with medicine or by freezing, burning (electrocautery), or surgery. Warts may come back.  HPV is a virus and cannot be cured with medicine or surgery.However, abnormal areas may be followed very closely by your caregiver and may be removed from the cervix, vagina, or vulva through office procedures or surgery. If your diagnosis is confirmed, your recent sexual partners need treatment. This is true even if they are symptom-free or have a negative culture or evaluation. They should not have sex until their caregiver says it is okay. HOME CARE INSTRUCTIONS  All sexual partners should be informed, tested, and treated for all STDs.  Take your antibiotics as directed. Finish them even if you start to feel better.  Only take over-the-counter or prescription medicines for pain, discomfort, or fever as directed by your caregiver.  Rest.  Eat a balanced diet and drink enough fluids to keep your urine clear or pale yellow.  Do not have sex until treatment is completed and you have followed up with your caregiver. STDs should be checked after treatment.  Keep all follow-up appointments, Pap tests, and blood tests as directed by your caregiver.  Only use latex condoms and water-soluble lubricants during sexual activity. Do not use  petroleum jelly or oils.  Avoid alcohol and illegal drugs.  Get vaccinated for HPV and hepatitis. If you have not received these vaccines in the past, talk to your caregiver about whether one or both might be right for you.  Avoid risky sex practices that can break the skin. The only way to avoid getting an STD is to avoid all sexual activity.Latex condoms and dental dams (for oral sex) will help lessen the risk of getting an STD, but will not completely eliminate the risk. SEEK MEDICAL CARE IF:   You have a fever.  You have any new or worsening symptoms. Document Released: 04/10/2002 Document Revised: 04/12/2011 Document Reviewed: 04/17/2010 [facility] Patient Information 2013 [place].    Domestic Abuse You are being battered or abused if someone close to you hits, pushes, or physically hurts you in any way. You also are being abused if you are forced into activities. You are being sexually abused if you are forced to have sexual contact of any kind. You are being emotionally abused if you are made to feel worthless or if you are constantly threatened. It is important to remember that help is available. No one has the right to abuse you. PREVENTION OF FURTHER  ABUSE  Learn the warning signs of danger. This varies with situations but may include: the use of alcohol, threats, isolation from friends and family, or forced sexual contact. Leave if you feel that violence is going to occur.  If you are attacked or beaten, report it to the police so the abuse is documented. You do not have to press charges. The police can protect you while you or the attackers are leaving. Get the officer's name and badge number and a copy of the report.  Find someone you can trust and tell them what is happening to you: your caregiver, a nurse, clergy member, close friend or family member. Feeling ashamed is natural, but remember that you have done nothing wrong. No one deserves abuse. Document Released:  01/16/2000 Document Revised: 04/12/2011 Document Reviewed: 03/26/2010 [facility] Patient Information 2013 [place].    How Much is Too Much Alcohol? Drinking too much alcohol can cause injury, accidents, and health problems. These types of problems can include:   Car crashes.  Falls.  Family fighting (domestic violence).  Drowning.  Fights.  Injuries.  Burns.  Damage to certain organs.  Having a baby with birth defects. ONE DRINK CAN BE TOO MUCH WHEN YOU ARE:  Working.  Pregnant or breastfeeding.  Taking medicines. Ask your doctor.  Driving or planning to drive. If you or someone you know has a drinking problem, get help from a doctor.  Document Released: 11/14/2008 Document Revised: 04/12/2011 Document Reviewed: 11/14/2008 [facility] Patient Information 2013 [place].   Smoking Hazards Smoking cigarettes is extremely bad for your health. Tobacco smoke has over 200 known poisons in it. There are over 60 chemicals in tobacco smoke that cause cancer. Some of the chemicals found in cigarette smoke include:   Cyanide.  Benzene.  Formaldehyde.  Methanol (wood alcohol).  Acetylene (fuel used in welding torches).  Ammonia. Cigarette smoke also contains the poisonous gases nitrogen oxide and carbon monoxide.  Cigarette smokers have an increased risk of many serious medical problems and Smoking causes approximately:  90% of all lung cancer deaths in men.  80% of all lung cancer deaths in women.  90% of deaths from chronic obstructive lung disease. Compared with nonsmokers, smoking increases the risk of:  Coronary heart disease by 2 to 4 times.  Stroke by 2 to 4 times.  Men developing lung cancer by 23 times.  Women developing lung cancer by 13 times.  Dying from chronic obstructive lung diseases by 12 times.  . Smoking is the most preventable cause of death and disease in our society.  WHY IS SMOKING ADDICTIVE?  Nicotine is the chemical  agent in tobacco that is capable of causing addiction or dependence.  When you smoke and inhale, nicotine is absorbed rapidly into the bloodstream through your lungs. Nicotine absorbed through the lungs is capable of creating a powerful addiction. Both inhaled and non-inhaled nicotine may be addictive.  Addiction studies of cigarettes and spit tobacco show that addiction to nicotine occurs mainly during the teen years, when young people begin using tobacco products. WHAT ARE THE BENEFITS OF QUITTING?  There are many health benefits to quitting smoking.   Likelihood of developing cancer and heart disease decreases. Health improvements are seen almost immediately.  Blood pressure, pulse rate, and breathing patterns start returning to normal soon after quitting. QUITTING SMOKING   American Lung Association - 1-800-LUNGUSA  American Cancer Society - 1-800-ACS-2345 Document Released: 02/26/2004 Document Revised: 04/12/2011 Document Reviewed: 10/30/2008 [facility] Patient Information 2013 [place]  [place].   Stress Management Stress is a state of physical or mental tension that often results from changes in your life or normal routine. Some common causes of stress are:  Death of a loved one.  Injuries or severe illnesses.  Getting fired or changing jobs.  Moving into a new home. Other causes may be:  Sexual problems.  Business or financial losses.  Taking on a large debt.  Regular conflict with someone at home or at work.  Constant tiredness from lack of sleep. It is not just bad things that are stressful. It may be stressful to:  Win the lottery.  Get married.  Buy a new car. The amount of stress that can be easily tolerated varies from person to person. Changes generally cause stress, regardless of the types of change. Too much stress can affect your health. It may lead to physical or emotional problems. Too little stress (boredom) may also become stressful. SUGGESTIONS TO  REDUCE STRESS:  Talk things over with your family and friends. It often is helpful to share your concerns and worries. If you feel your problem is serious, you may want to get help from a professional counselor.  Consider your problems one at a time instead of lumping them all together. Trying to take care of everything at once may seem impossible. List all the things you need to do and then start with the most important one. Set a goal to accomplish 2 or 3 things each day. If you expect to do too many in a single day you will naturally fail, causing you to feel even more stressed.  Do not use alcohol or drugs to relieve stress. Although you may feel better for a short time, they do not remove the problems that caused the stress. They can also be habit forming.  Exercise regularly - at least 3 times per week. Physical exercise can help to relieve that "uptight" feeling and will relax you.  The shortest distance between despair and hope is often a good night's sleep.  Go to bed and get up on time allowing yourself time for appointments without being rushed.  Take a short "time-out" period from any stressful situation that occurs during the day. Close your eyes and take some deep breaths. Starting with the muscles in your face, tense them, hold it for a few seconds, then relax. Repeat this with the muscles in your neck, shoulders, hand, stomach, back and legs.  Take good care of yourself. Eat a balanced diet and get plenty of rest.  Schedule time for having fun. Take a break from your daily routine to relax. HOME CARE INSTRUCTIONS   Call if you feel overwhelmed by your problems and feel you can no longer manage them on your own.  Return immediately if you feel like hurting yourself or someone else. Document Released: 07/14/2000 Document Revised: 04/12/2011 Document Reviewed: 03/06/2007 ExitCare Patient Information 2013 ExitCare[employer].   

## 2017-12-16 NOTE — Progress Notes (Signed)
Patient scheduled for R Breast Ultrasound at [facility] on Monday 12/19/17 at 3:00. Order faxed. Patient will go to the breast center and sign release of records for transfer of care.

## 2017-12-16 NOTE — Progress Notes (Signed)
22 y.o. G0P0000 Single  Caucasian Fe here for annual exam. Periods normal, with OCP use. No missed periods or missed pills. 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 Same partner for 4 years and very happy.  Patient's last menstrual period was 12/04/2017 (exact date).          Sexually active: Yes.    The current method of family planning is OCP (estrogen/progesterone).    Exercising: Yes.    walking Smoker:  no  Review of Systems  Constitutional: Negative.   HENT: Negative.   Eyes: Negative.   Respiratory: Negative.   Cardiovascular: Negative.   Gastrointestinal: Negative.   Genitourinary: Negative.   Musculoskeletal: Negative.   Skin: Negative.   Neurological: Negative.   Endo/Heme/Allergies: Negative.   Psychiatric/Behavioral: Negative.     Health Maintenance: Pap:  12-14-16 neg History of Abnormal Pap: no MMG:  11-13-15 rt breast u/s birads 3:prob benign short interval f/u suggested Self Breast exams: yes Colonoscopy:  none BMD:   none TDaP:  2015 Shingles: no Pneumonia: no Hep C and HIV: not done Labs: no   reports that she has never smoked. She has never used smokeless tobacco. She reports that she drinks about 2.0 standard drinks of alcohol per week. She reports that she does not use drugs.  History reviewed. No pertinent past medical history.  Past Surgical History:  Procedure Laterality Date  . WISDOM TOOTH EXTRACTION      Current Outpatient Medications  Medication Sig Dispense Refill  . Norethindrone Acetate-Ethinyl Estrad-FE (LOESTRIN 24 FE) 1-20 MG-MCG(24) tablet Take 1 tablet daily by mouth. 3 Package 4   No current facility-administered medications for this visit.     Family History  Problem Relation Age of Onset  . Diabetes Father   . Heart attack Father   . Hyperlipidemia Father   . Hypertension Father   . Hypertension Maternal Grandmother   . Hyperlipidemia Maternal Grandmother   . Diabetes Maternal Grandmother     ROS:  Pertinent items are  noted in HPI.  Otherwise, a comprehensive ROS was negative.  Exam:   BP 110/68   Pulse 60   Resp 16   Ht 5' 1.75" (1.568 m)   Wt 113 lb (51.3 kg)   LMP 12/04/2017 (Exact Date)   BMI 20.84 kg/m  Height: 5' 1.75" (156.8 cm) Ht Readings from Last 3 Encounters:  12/16/17 5' 1.75" (1.568 m)  12/14/16 5' 1.75" (1.568 m)  12/17/15 5' 2.25" (1.581 m)    General appearance: alert, cooperative and appears stated age Head: Normocephalic, without obvious abnormality, atraumatic Neck: no adenopathy, supple, symmetrical, trachea midline and thyroid normal to inspection and palpation Lungs: clear to auscultation bilaterally Breasts: normal appearance, no masses or tenderness, No nipple retraction or dimpling, No nipple discharge or bleeding, No axillary or supraclavicular adenopathy, 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 size change from previous exam, non tender, noted at 9 o'clock  2-3 cm 2 fb out from areola Heart: regular rate and rhythm Abdomen: soft, non-tender; no masses,  no organomegaly Extremities: extremities normal, atraumatic, no cyanosis or edema Skin: Skin color, texture, turgor normal. No rashes or lesions Lymph nodes: Cervical, supraclavicular, and axillary nodes normal. No abnormal inguinal nodes palpated Neurologic: Grossly normal   Pelvic: External genitalia:  no lesions              Urethra:  normal appearing urethra with no masses, tenderness or lesions              Bartholin's  and Skene's: normal                 Vagina: normal appearing vagina with normal color and discharge, no lesions              Cervix: no cervical motion tenderness, no lesions and nulliparous appearance              Pap taken: No. Bimanual Exam:  Uterus:  normal size, contour, position, consistency, mobility, non-tender and anteflexed              Adnexa: normal adnexa and no mass, fullness, tenderness               Rectovaginal: Confirms               Anus:  normal appearance no  lesions  Chaperone present: yes  A:  Well Woman with normal exam  Contraception OCP desired  Known right breast fibroadenoma ? Size change  P:   Reviewed health and wellness pertinent to exam  Risks/benefits/warninig signs given. Rx  Loestrin 24 Fe see order with instructions  Discussed possible size change and [place] recommended. Patient agreeable. Will schedule prior to leaving today.  Pap smear: no   counseled on breast self exam, STD prevention, HIV risk factors and prevention, use and side effects of OCP's.  return annually or prn  An After Visit Summary was printed and given to the patient.

## 2017-12-20 ENCOUNTER — Other Ambulatory Visit: Payer: Self-pay | Admitting: Certified Nurse Midwife

## 2017-12-20 ENCOUNTER — Telehealth: Payer: Self-pay | Admitting: Certified Nurse Midwife

## 2017-12-20 DIAGNOSIS — N6310 Unspecified lump in the right breast, unspecified quadrant: Secondary | ICD-10-CM

## 2017-12-20 NOTE — Telephone Encounter (Signed)
Routing to [employer], CNM.  TBC placed order in Epic, please sign.

## 2017-12-20 NOTE — Telephone Encounter (Signed)
Lori [name] at [name] is calling asking if [name], CNM would send an order to Lori [name] for this patient's [place] RT BRST BX. Appointment scheduled 12/23/17 @ 2:45pm.  Lori [name] to fax a report to [name], CNM this afternoon.

## 2017-12-21 NOTE — Telephone Encounter (Signed)
done

## 2017-12-21 NOTE — Telephone Encounter (Signed)
Encounter closed

## 2017-12-23 ENCOUNTER — Ambulatory Visit
Admission: RE | Admit: 2017-12-23 | Discharge: 2017-12-23 | Disposition: A | Discharge status: Home / Self Care | Payer: PRIVATE HEALTH INSURANCE | Source: Ambulatory Visit | Attending: Certified Nurse Midwife | Admitting: Certified Nurse Midwife

## 2017-12-23 DIAGNOSIS — N6310 Unspecified lump in the right breast, unspecified quadrant: Secondary | ICD-10-CM

## 2017-12-23 HISTORY — PX: BREAST SURGERY: SHX581

## 2018-01-09 IMAGING — US ULTRASOUND RIGHT BREAST LIMITED
1 series · 5 of 5 positions shown · non-contrast
Comparison: Previous exam(s).

CLINICAL DATA: Palpable lump in the right breast felt by the
patient's physician.

EXAM:
ULTRASOUND OF THE RIGHT BREAST

[Series 1: ultrasound right breast limited · 0.06mm/px · 5 of 5 slices shown]
[im 1/5]
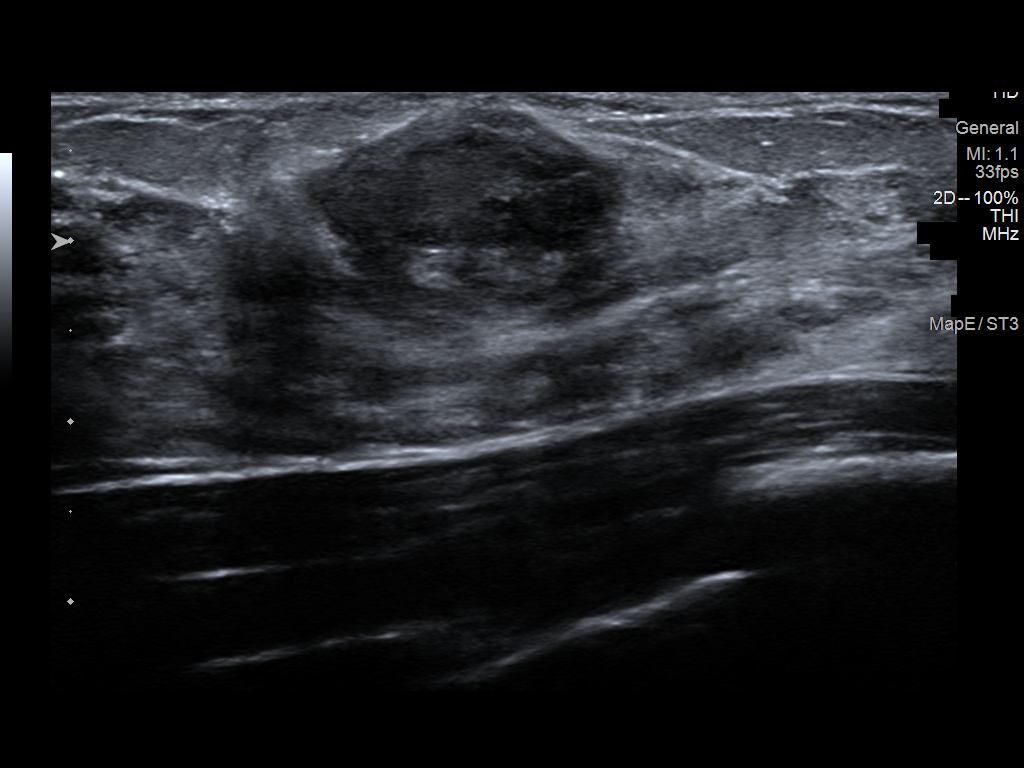
[im 2/5]
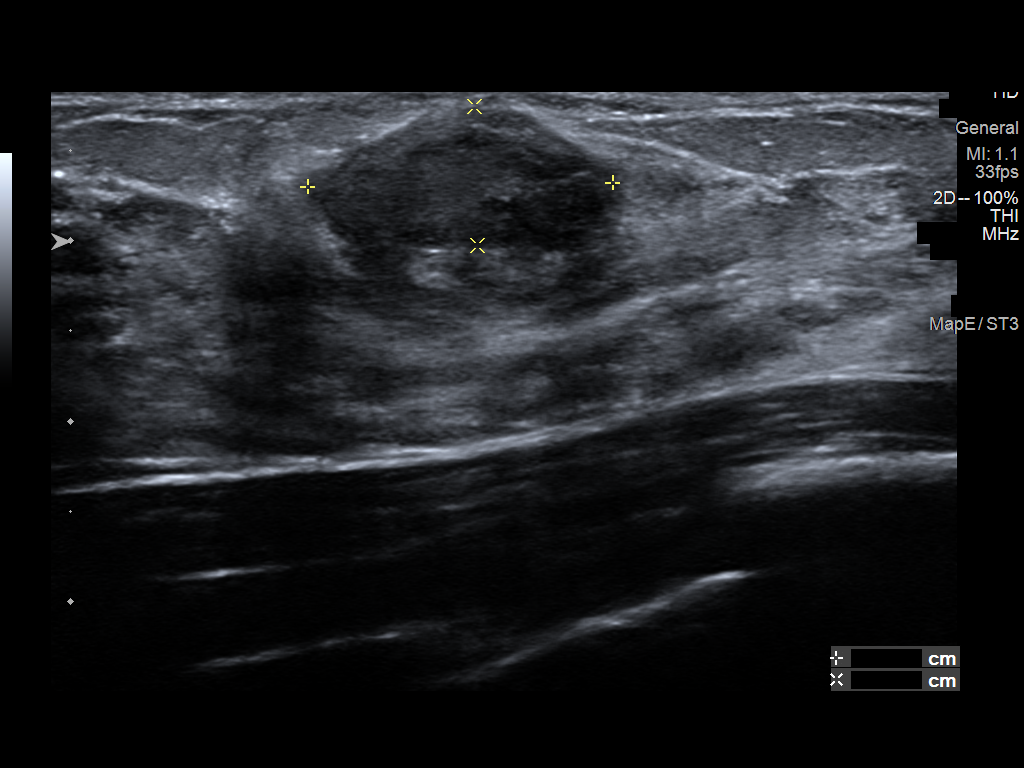
[im 3/5]
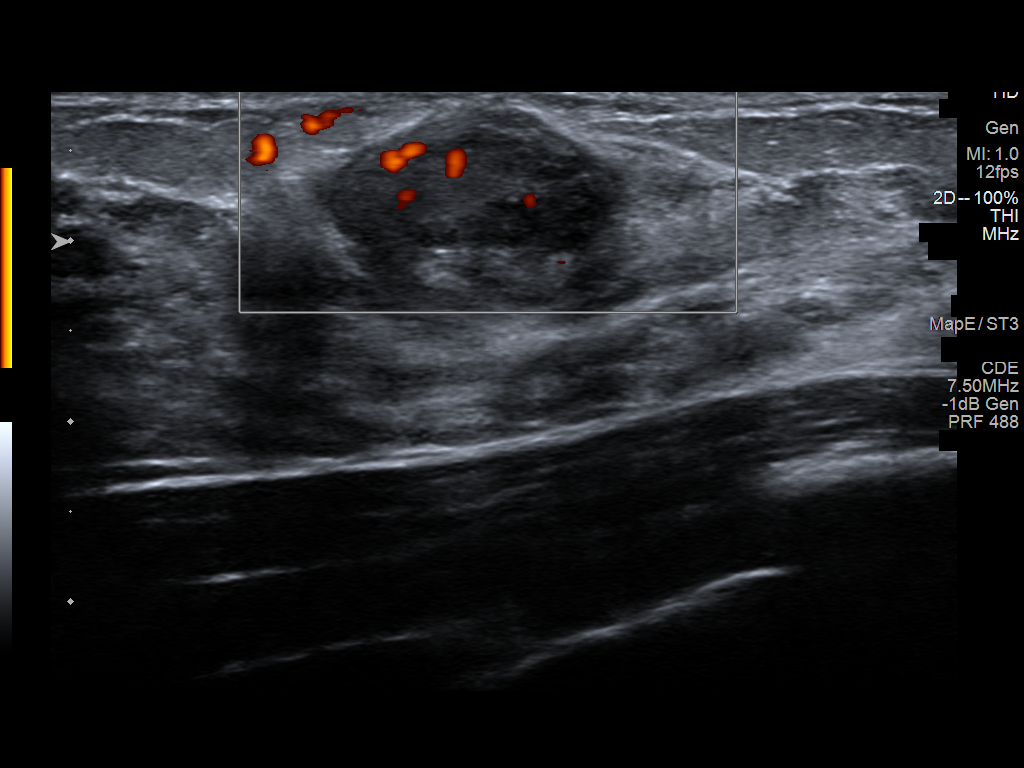
[im 4/5]
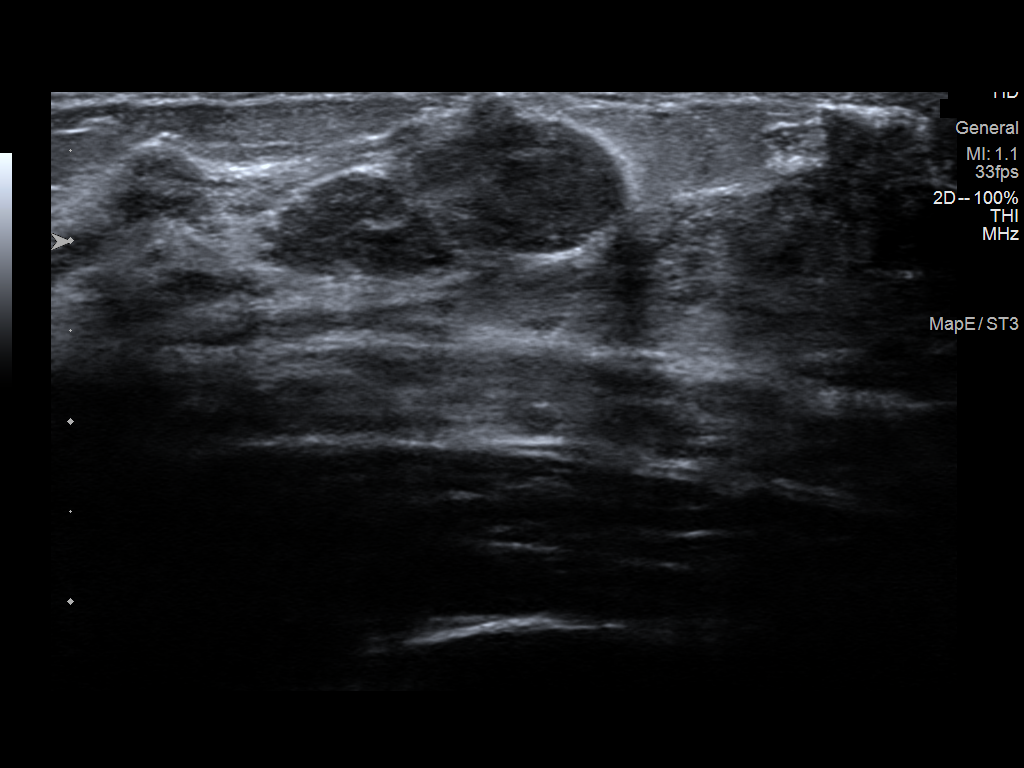
[im 5/5]
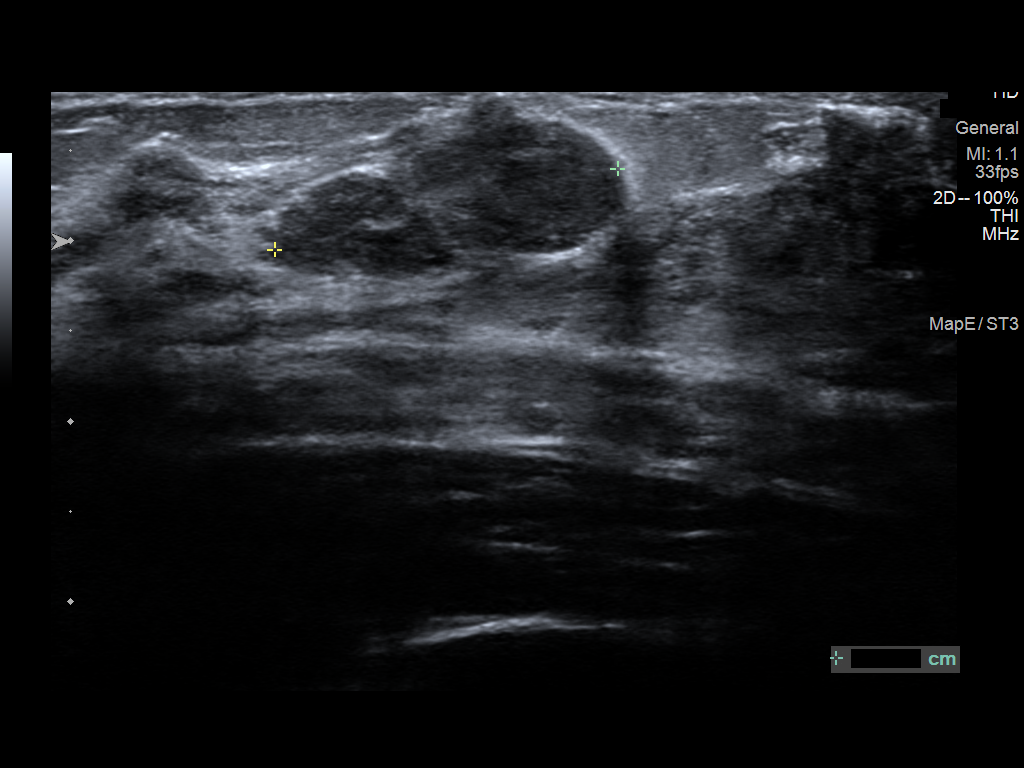

[5 of 5 positions shown; findings below may reference images not displayed]

FINDINGS: On physical exam, a palpable lump is felt at 9 o'clock.

Targeted ultrasound is performed, showing a hypoechoic mass at 9
o'clock, 2 cm from the nipple measuring 1.7 x 0.8 x 2.0 cm. This is
likely a fibroadenoma.
IMPRESSION: Probable right breast fibroadenoma.

RECOMMENDATION:
Six-month follow-up ultrasound to ensure stability of the probably
benign right breast mass, likely a fibroadenoma.

I have discussed the findings and recommendations with the patient.
Results were also provided in writing at the conclusion of the
visit. If applicable, a reminder letter will be sent to the patient
regarding the next appointment.

BI-RADS CATEGORY  3: Probably benign finding(s) - short interval
follow-up suggested.

## 2018-02-14 ENCOUNTER — Encounter: Payer: Self-pay | Admitting: Certified Nurse Midwife

## 2018-02-27 ENCOUNTER — Other Ambulatory Visit: Payer: Self-pay | Admitting: Certified Nurse Midwife

## 2018-02-27 DIAGNOSIS — Z3041 Encounter for surveillance of contraceptive pills: Secondary | ICD-10-CM

## 2018-02-27 NOTE — Telephone Encounter (Signed)
Medication refill request: OCP Last AEX:  12/16/17 DL Next AEX: none scheduled  Last MMG (if hormonal medication request): n/a Refill authorized: 12/16/17 #3packs/4 R to [facility].   Left voicemail for pt. Rx sent for a year back in November.

## 2018-02-27 NOTE — Telephone Encounter (Signed)
Patient requesting a refill on birth control. [facility] [phone]

## 2019-01-24 ENCOUNTER — Other Ambulatory Visit: Payer: Self-pay | Admitting: Certified Nurse Midwife

## 2019-01-24 ENCOUNTER — Other Ambulatory Visit: Payer: Self-pay

## 2019-01-24 DIAGNOSIS — Z3041 Encounter for surveillance of contraceptive pills: Secondary | ICD-10-CM

## 2019-01-24 NOTE — Telephone Encounter (Signed)
Patient calling to check on refill for birth control. States she would like to be able to pick it up today because she needs it Sunday and is worried about the pharmacy being closed during [employer].

## 2019-01-24 NOTE — Telephone Encounter (Signed)
Medication refill request: LOESTRIN Last AEX:  12/16/17 DL Next AEX: 01/29/19 Last MMG (if hormonal medication request): n/a Refill authorized: Please advise; Order pended #1pack w/0 refills if authorized

## 2019-01-24 NOTE — Telephone Encounter (Signed)
Patient's mother, Joelene Millin, is calling regarding refill. Joelene Millin stated that the pharmacy will be closed tomorrow and Friday and is requesting that the refill be sent in as soon as possible. Patient is schedule for annual exam with [name], CNM, on Monday, 01/29/2019 at 2pm.

## 2019-01-29 ENCOUNTER — Encounter: Payer: Self-pay | Admitting: Certified Nurse Midwife

## 2019-01-29 ENCOUNTER — Ambulatory Visit: Payer: PRIVATE HEALTH INSURANCE | Admitting: Certified Nurse Midwife

## 2019-01-29 ENCOUNTER — Other Ambulatory Visit: Payer: Self-pay

## 2019-01-29 VITALS — BP 110/68 | HR 68 | Temp 97.2°F | Resp 16 | Ht 62.25 in | Wt 115.0 lb

## 2019-01-29 DIAGNOSIS — Z01419 Encounter for gynecological examination (general) (routine) without abnormal findings: Secondary | ICD-10-CM | POA: Diagnosis not present

## 2019-01-29 DIAGNOSIS — Z3041 Encounter for surveillance of contraceptive pills: Secondary | ICD-10-CM

## 2019-01-29 MED ORDER — LARIN 24 FE 1-20 MG-MCG(24) PO TABS: 1.0000 | ORAL_TABLET | Freq: Every day | ORAL | 4 refills | Status: DC

## 2019-01-29 NOTE — Patient Instructions (Signed)
General topics  Next pap or exam is  due in 1 year Take a Women's multivitamin Take 1200 mg. of calcium daily - prefer dietary If any concerns in interim to call back  Breast Self-Awareness Practicing breast self-awareness may pick up problems early, prevent significant medical complications, and possibly save your life. By practicing breast self-awareness, you can become familiar with how your breasts look and feel and if your breasts are changing. This allows you to notice changes early. It can also offer you some reassurance that your breast health is good. One way to learn what is normal for your breasts and whether your breasts are changing is to do a breast self-exam. If you find a lump or something that was not present in the past, it is best to contact your caregiver right away. Other findings that should be evaluated by your caregiver include nipple discharge, especially if it is bloody; skin changes or reddening; areas where the skin seems to be pulled in (retracted); or new lumps and bumps. Breast pain is seldom associated with cancer (malignancy), but should also be evaluated by a caregiver. BREAST SELF-EXAM The best time to examine your breasts is 5 7 days after your menstrual period is over.  ExitCare Patient Information 2013 [employer].   Exercise to Stay Healthy Exercise helps you become and stay healthy. EXERCISE IDEAS AND TIPS Choose exercises that:  You enjoy.  Fit into your day. You do not need to exercise really hard to be healthy. You can do exercises at a slow or medium level and stay healthy. You can:  Stretch before and after working out.  Try yoga, Pilates, or tai chi.  Lift weights.  Walk fast, swim, jog, run, climb stairs, bicycle, dance, or rollerskate.  Take aerobic classes. Exercises that burn about 150 calories:  Running 1  miles in 15 minutes.  Playing volleyball for 45 to 60 minutes.  Washing and waxing a car for 45 to 60  minutes.  Playing touch football for 45 minutes.  Walking 1  miles in 35 minutes.  Pushing a stroller 1  miles in 30 minutes.  Playing basketball for 30 minutes.  Raking leaves for 30 minutes.  Bicycling 5 miles in 30 minutes.  Walking 2 miles in 30 minutes.  Dancing for 30 minutes.  Shoveling snow for 15 minutes.  Swimming laps for 20 minutes.  Walking up stairs for 15 minutes.  Bicycling 4 miles in 15 minutes.  Gardening for 30 to 45 minutes.  Jumping rope for 15 minutes.  Washing windows or floors for 45 to 60 minutes. Document Released: 02/20/2010 Document Revised: 04/12/2011 Document Reviewed: 02/20/2010 ExitCare Patient Information 2013 [employer].   Other topics ( that may be useful information):    Sexually Transmitted Disease Sexually transmitted disease (STD) refers to any infection that is passed from person to person during sexual activity. This may happen by way of saliva, semen, blood, vaginal mucus, or urine. Common STDs include:  Gonorrhea.  Chlamydia.  Syphilis.  HIV/AIDS.  Genital herpes.  Hepatitis B and C.  Trichomonas.  Human papillomavirus (HPV).  Pubic lice. CAUSES  An STD may be spread by bacteria, virus, or parasite. A person can get an STD by:  Sexual intercourse with an infected person.  Sharing sex toys with an infected person.  Sharing needles with an infected person.  Having intimate contact with the genitals, mouth, or rectal areas of an infected person. SYMPTOMS  Some people may not have any symptoms, but   they can still pass the infection to others. Different STDs have different symptoms. Symptoms include:  Painful or bloody urination.  Pain in the pelvis, abdomen, vagina, anus, throat, or eyes.  Skin rash, itching, irritation, growths, or sores (lesions). These usually occur in the genital or anal area.  Abnormal vaginal discharge.  Penile discharge in men.  Soft, flesh-colored skin growths in the  genital or anal area.  Fever.  Pain or bleeding during sexual intercourse.  Swollen glands in the groin area.  Yellow skin and eyes (jaundice). This is seen with hepatitis. DIAGNOSIS  To make a diagnosis, your caregiver may:  Take a medical history.  Perform a physical exam.  Take a specimen (culture) to be examined.  Examine a sample of discharge under a microscope.  Perform blood test TREATMENT   Chlamydia, gonorrhea, trichomonas, and syphilis can be cured with antibiotic medicine.  Genital herpes, hepatitis, and HIV can be treated, but not cured, with prescribed medicines. The medicines will lessen the symptoms.  Genital warts from HPV can be treated with medicine or by freezing, burning (electrocautery), or surgery. Warts may come back.  HPV is a virus and cannot be cured with medicine or surgery.However, abnormal areas may be followed very closely by your caregiver and may be removed from the cervix, vagina, or vulva through office procedures or surgery. If your diagnosis is confirmed, your recent sexual partners need treatment. This is true even if they are symptom-free or have a negative culture or evaluation. They should not have sex until their caregiver says it is okay. HOME CARE INSTRUCTIONS  All sexual partners should be informed, tested, and treated for all STDs.  Take your antibiotics as directed. Finish them even if you start to feel better.  Only take over-the-counter or prescription medicines for pain, discomfort, or fever as directed by your caregiver.  Rest.  Eat a balanced diet and drink enough fluids to keep your urine clear or pale yellow.  Do not have sex until treatment is completed and you have followed up with your caregiver. STDs should be checked after treatment.  Keep all follow-up appointments, Pap tests, and blood tests as directed by your caregiver.  Only use latex condoms and water-soluble lubricants during sexual activity. Do not use  petroleum jelly or oils.  Avoid alcohol and illegal drugs.  Get vaccinated for HPV and hepatitis. If you have not received these vaccines in the past, talk to your caregiver about whether one or both might be right for you.  Avoid risky sex practices that can break the skin. The only way to avoid getting an STD is to avoid all sexual activity.Latex condoms and dental dams (for oral sex) will help lessen the risk of getting an STD, but will not completely eliminate the risk. SEEK MEDICAL CARE IF:   You have a fever.  You have any new or worsening symptoms. Document Released: 04/10/2002 Document Revised: 04/12/2011 Document Reviewed: 04/17/2010 [facility] Patient Information 2013 [employer].    Domestic Abuse You are being battered or abused if someone close to you hits, pushes, or physically hurts you in any way. You also are being abused if you are forced into activities. You are being sexually abused if you are forced to have sexual contact of any kind. You are being emotionally abused if you are made to feel worthless or if you are constantly threatened. It is important to remember that help is available. No one has the right to abuse you. PREVENTION OF FURTHER  ABUSE  Learn the warning signs of danger. This varies with situations but may include: the use of alcohol, threats, isolation from friends and family, or forced sexual contact. Leave if you feel that violence is going to occur.  If you are attacked or beaten, report it to the police so the abuse is documented. You do not have to press charges. The police can protect you while you or the attackers are leaving. Get the officer's name and badge number and a copy of the report.  Find someone you can trust and tell them what is happening to you: your caregiver, a nurse, clergy member, close friend or family member. Feeling ashamed is natural, but remember that you have done nothing wrong. No one deserves abuse. Document Released:  01/16/2000 Document Revised: 04/12/2011 Document Reviewed: 03/26/2010 ExitCare Patient Information 2013 [employer].    How Much is Too Much Alcohol? Drinking too much alcohol can cause injury, accidents, and health problems. These types of problems can include:   Car crashes.  Falls.  Family fighting (domestic violence).  Drowning.  Fights.  Injuries.  Burns.  Damage to certain organs.  Having a baby with birth defects. ONE DRINK CAN BE TOO MUCH WHEN YOU ARE:  Working.  Pregnant or breastfeeding.  Taking medicines. Ask your doctor.  Driving or planning to drive. If you or someone you know has a drinking problem, get help from a doctor.  Document Released: 11/14/2008 Document Revised: 04/12/2011 Document Reviewed: 11/14/2008 ExitCare Patient Information 2013 [employer].   Smoking Hazards Smoking cigarettes is extremely bad for your health. Tobacco smoke has over 200 known poisons in it. There are over 60 chemicals in tobacco smoke that cause cancer. Some of the chemicals found in cigarette smoke include:   Cyanide.  Benzene.  Formaldehyde.  Methanol (wood alcohol).  Acetylene (fuel used in welding torches).  Ammonia. Cigarette smoke also contains the poisonous gases nitrogen oxide and carbon monoxide.  Cigarette smokers have an increased risk of many serious medical problems and Smoking causes approximately:  90% of all lung cancer deaths in men.  80% of all lung cancer deaths in women.  90% of deaths from chronic obstructive lung disease. Compared with nonsmokers, smoking increases the risk of:  Coronary heart disease by 2 to 4 times.  Stroke by 2 to 4 times.  Men developing lung cancer by 23 times.  Women developing lung cancer by 13 times.  Dying from chronic obstructive lung diseases by 12 times.  . Smoking is the most preventable cause of death and disease in our society.  WHY IS SMOKING ADDICTIVE?  Nicotine is the chemical  agent in tobacco that is capable of causing addiction or dependence.  When you smoke and inhale, nicotine is absorbed rapidly into the bloodstream through your lungs. Nicotine absorbed through the lungs is capable of creating a powerful addiction. Both inhaled and non-inhaled nicotine may be addictive.  Addiction studies of cigarettes and spit tobacco show that addiction to nicotine occurs mainly during the teen years, when young people begin using tobacco products. WHAT ARE THE BENEFITS OF QUITTING?  There are many health benefits to quitting smoking.   Likelihood of developing cancer and heart disease decreases. Health improvements are seen almost immediately.  Blood pressure, pulse rate, and breathing patterns start returning to normal soon after quitting. QUITTING SMOKING   American Lung Association - 1-800-LUNGUSA  American Cancer Society - 1-800-ACS-2345 Document Released: 02/26/2004 Document Revised: 04/12/2011 Document Reviewed: 10/30/2008 ExitCare Patient Information 2013 ExitCare,   LLC.   Stress Management Stress is a state of physical or mental tension that often results from changes in your life or normal routine. Some common causes of stress are:  Death of a loved one.  Injuries or severe illnesses.  Getting fired or changing jobs.  Moving into a new home. Other causes may be:  Sexual problems.  Business or financial losses.  Taking on a large debt.  Regular conflict with someone at home or at work.  Constant tiredness from lack of sleep. It is not just bad things that are stressful. It may be stressful to:  Win the lottery.  Get married.  Buy a new car. The amount of stress that can be easily tolerated varies from person to person. Changes generally cause stress, regardless of the types of change. Too much stress can affect your health. It may lead to physical or emotional problems. Too little stress (boredom) may also become stressful. SUGGESTIONS TO  REDUCE STRESS:  Talk things over with your family and friends. It often is helpful to share your concerns and worries. If you feel your problem is serious, you may want to get help from a professional counselor.  Consider your problems one at a time instead of lumping them all together. Trying to take care of everything at once may seem impossible. List all the things you need to do and then start with the most important one. Set a goal to accomplish 2 or 3 things each day. If you expect to do too many in a single day you will naturally fail, causing you to feel even more stressed.  Do not use alcohol or drugs to relieve stress. Although you may feel better for a short time, they do not remove the problems that caused the stress. They can also be habit forming.  Exercise regularly - at least 3 times per week. Physical exercise can help to relieve that "uptight" feeling and will relax you.  The shortest distance between despair and hope is often a good night's sleep.  Go to bed and get up on time allowing yourself time for appointments without being rushed.  Take a short "time-out" period from any stressful situation that occurs during the day. Close your eyes and take some deep breaths. Starting with the muscles in your face, tense them, hold it for a few seconds, then relax. Repeat this with the muscles in your neck, shoulders, hand, stomach, back and legs.  Take good care of yourself. Eat a balanced diet and get plenty of rest.  Schedule time for having fun. Take a break from your daily routine to relax. HOME CARE INSTRUCTIONS   Call if you feel overwhelmed by your problems and feel you can no longer manage them on your own.  Return immediately if you feel like hurting yourself or someone else. Document Released: 07/14/2000 Document Revised: 04/12/2011 Document Reviewed: 03/06/2007 ExitCare Patient Information 2013 ExitCare[employer].   Oral Contraception Use Oral contraceptive pills  (OCPs) are medicines that you take to prevent pregnancy. OCPs work by:  Preventing the ovaries from releasing eggs.  Thickening mucus in the lower part of the uterus (cervix), which prevents sperm from entering the uterus.  Thinning the lining of the uterus (endometrium), which prevents a fertilized egg from attaching to the endometrium. OCPs are highly effective when taken exactly as prescribed. However, OCPs do not prevent sexually transmitted infections (STIs). Safe sex practices, such as using condoms while on an OCP, can help prevent STIs. Before taking   OCPs, you may have a physical exam, blood test, and Pap test. A Pap test involves taking a sample of cells from your cervix to check for cancer. Discuss with your health care provider the possible side effects of the OCP you may be prescribed. When you start an OCP, be aware that it can take 2-3 months for your body to adjust to changes in hormone levels. How to take oral contraceptive pills Follow instructions from your health care provider about how to start taking your first cycle of OCPs. Your health care provider may recommend that you:  Start the pill on day 1 of your menstrual period. If you start at this time, you will not need any backup form of birth control (contraception), such as condoms.  Start the pill on the first Sunday after your menstrual period or on the day you get your prescription. In these cases, you will need to use backup contraception for the first week.  Start the pill at any time of your cycle. ? If you take the pill within 5 days of the start of your period, you will not need a backup form of contraception. ? If you start at any other time of your menstrual cycle, you will need to use another form of contraception for 7 days. If your OCP is the type called a minipill, it will protect you from pregnancy after taking it for 2 days (48 hours), and you can stop using backup contraception after that time. After you  have started taking OCPs:  If you forget to take 1 pill, take it as soon as you remember. Take the next pill at the regular time.  If you miss 2 or more pills, call your health care provider. Different pills have different instructions for missed doses. Use backup birth control until your next menstrual period starts.  If you use a 28-day pack that contains inactive pills and you miss 1 of the last 7 pills (pills with no hormones), throw away the rest of the non-hormone pills and start a new pill pack. No matter which day you start the OCP, you will always start a new pack on that same day of the week. Have an extra pack of OCPs and a backup contraceptive method available in case you miss some pills or lose your OCP pack. Follow these instructions at home:  Do not use any products that contain nicotine or tobacco, such as cigarettes and e-cigarettes. If you need help quitting, ask your health care provider.  Always use a condom to protect against STIs. OCPs do not protect against STIs.  Use a calendar to mark the days of your menstrual period.  Read the information and directions that came with your OCP. Talk to your health care provider if you have questions. Contact a health care provider if:  You develop nausea and vomiting.  You have abnormal vaginal discharge or bleeding.  You develop a rash.  You miss your menstrual period. Depending on the type of OCP you are taking, this may be a sign of pregnancy. Ask your health care provider for more information.  You are losing your hair.  You need treatment for mood swings or depression.  You get dizzy when taking the OCP.  You develop acne after taking the OCP.  You become pregnant or think you may be pregnant.  You have diarrhea, constipation, and abdominal pain or cramps.  You miss 2 or more pills. Get help right away if:  You develop chest   pain.  You develop shortness of breath.  You have an uncontrolled or severe headache.  You  develop numbness or slurred speech.  You develop visual or speech problems.  You develop pain, redness, and swelling in your legs.  You develop weakness or numbness in your arms or legs. Summary  Oral contraceptive pills (OCPs) are medicines that you take to prevent pregnancy.  OCPs do not prevent sexually transmitted infections (STIs). Always use a condom to protect against STIs.  When you start an OCP, be aware that it can take 2-3 months for your body to adjust to changes in hormone levels.  Read all the information and directions that come with your OCP. This information is not intended to replace advice given to you by your health care provider. Make sure you discuss any questions you have with your health care provider. Document Released: 01/07/2011 Document Revised: 05/12/2018 Document Reviewed: 03/01/2016 Elsevier Patient Education  2020 [employer] Inc.  

## 2019-01-29 NOTE — Progress Notes (Signed)
23 y.o. G0P0000 Single  Caucasian Fe here for annual exam. Periods normal, no issues. 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 Passed boards!! No partner change. Patient declines STD screening. Planning marriage next year. All family doing well. No issues today.  Patient's last menstrual period was 01/25/2019 (exact date).          Sexually active: Yes.    The current method of family planning is OCP (estrogen/progesterone).    Exercising: Yes.    walking Smoker:  no  Review of Systems  Constitutional: Negative.   HENT: Negative.   Eyes: Negative.   Respiratory: Negative.   Cardiovascular: Negative.   Gastrointestinal: Negative.   Genitourinary: Negative.   Musculoskeletal: Negative.   Skin: Negative.   Neurological: Negative.   Endo/Heme/Allergies: Negative.   Psychiatric/Behavioral: Negative.     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 She has never used smokeless tobacco. She reports current alcohol use of about 2.0 standard drinks of alcohol per week. She reports that she does not use drugs.  No past medical history on file.  Past Surgical History:  Procedure Laterality Date  . WISDOM TOOTH EXTRACTION      Current Outpatient Medications  Medication Sig Dispense Refill  . LARIN 24 FE 1-20 MG-MCG(24) tablet TAKE 1 TABLET BY MOUTH DAILY. 1 Package 0   No current facility-administered medications for this visit.    Family History  Problem Relation Age of Onset  . Diabetes Father   . Heart attack Father   . Hyperlipidemia Father   . Hypertension Father   . Hypertension Maternal Grandmother   . Hyperlipidemia Maternal Grandmother   . Diabetes Maternal Grandmother     ROS:  Pertinent  items are noted in HPI.  Otherwise, a comprehensive ROS was negative.  Exam:   BP 110/68   Pulse 68   Temp (!) 97.2 F (36.2 C) (Skin)   Resp 16   Ht 5' 2.25" (1.581 m)   Wt 115 lb (52.2 kg)   LMP 01/25/2019 (Exact Date)   BMI 20.87 kg/m  Height: 5' 2.25" (158.1 cm) Ht Readings from Last 3 Encounters:  01/29/19 5' 2.25" (1.581 m)  12/16/17 5' 1.75" (1.568 m)  12/14/16 5' 1.75" (1.568 m)    General appearance: alert, cooperative and appears stated age Head: Normocephalic, without obvious abnormality, atraumatic Neck: no adenopathy, supple, symmetrical, trachea midline and thyroid normal to inspection and palpation Lungs: clear to auscultation bilaterally Breasts: normal appearance, no masses or tenderness, No nipple retraction or dimpling, No nipple discharge or bleeding, No axillary or supraclavicular adenopathy Heart: regular rate and rhythm Abdomen: soft, non-tender; no masses,  no organomegaly Extremities: extremities normal, atraumatic, no cyanosis or edema Skin: Skin color, texture, turgor normal. No rashes or lesions Lymph nodes: Cervical, supraclavicular, and axillary nodes normal. No abnormal inguinal nodes palpated Neurologic: Grossly normal   Pelvic: External genitalia:  no lesions              Urethra:  normal appearing urethra with no masses, tenderness or lesions              Bartholin's and Skene's: normal  Vagina: normal appearing vagina with normal color and discharge, no lesions              Cervix: no cervical motion tenderness, no lesions and nulliparous appearance              Pap taken: no Bimanual Exam:  Uterus:  normal size, contour, position, consistency, mobility, non-tender and anteverted              Adnexa: normal adnexa and no mass, fullness, tenderness               Rectovaginal: Confirms               Anus:  normal appearance no lesions  Chaperone present: yes  A:  Well Woman with normal exam  Contraception OCP desired  P:    Reviewed health and wellness pertinent to exam  Risks/benefits/warning signs with OCP reviewed.  Rx Larin 24 Fe see order with instructions  Pap smear: no  counseled on breast self exam, STD prevention, HIV risk factors and prevention, feminine hygiene, use and side effects of OCP's, adequate intake of calcium and vitamin D, diet and exercise  return annually or prn  An After Visit Summary was printed and given to the patient.

## 2019-04-25 ENCOUNTER — Encounter: Payer: Self-pay | Admitting: Certified Nurse Midwife

## 2019-08-02 DIAGNOSIS — U071 COVID-19: Secondary | ICD-10-CM

## 2019-08-02 HISTORY — DX: COVID-19: U07.1

## 2020-02-02 DIAGNOSIS — D75839 Thrombocytosis, unspecified: Secondary | ICD-10-CM

## 2020-02-02 HISTORY — DX: Thrombocytosis, unspecified: D75.839

## 2020-02-04 ENCOUNTER — Ambulatory Visit: Payer: PRIVATE HEALTH INSURANCE | Admitting: Certified Nurse Midwife

## 2020-02-19 IMAGING — US US BREAST BX W LOC DEV 1ST LESION IMG BX SPEC US GUIDE*R*
1 series · 10 of 10 positions shown · non-contrast
Comparison: Previous exam(s).

Addendum:
CLINICAL DATA: Patient presents for ultrasound-guided core biopsy
of RIGHT breast mass.

EXAM:
ULTRASOUND GUIDED RIGHT BREAST CORE NEEDLE BIOPSY

[Series 1: us breast bx w loc dev 1st lesion img bx spec us g · 0.06mm/px · 10 of 10 slices shown]
[im 1/10]
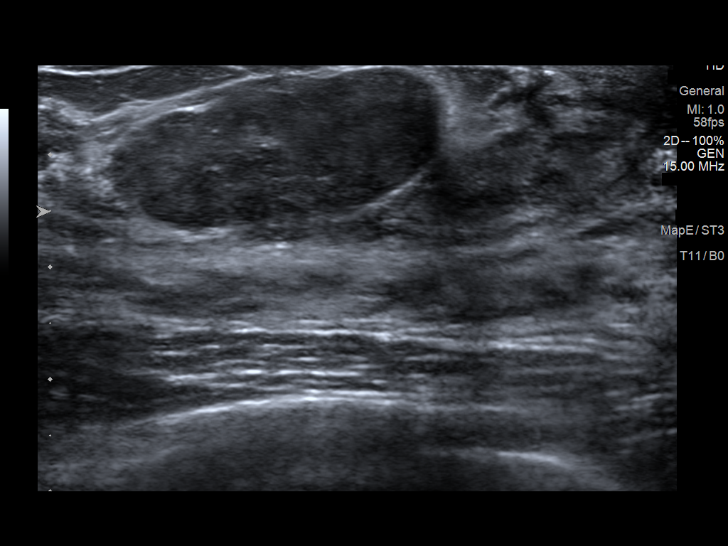
[im 2/10]
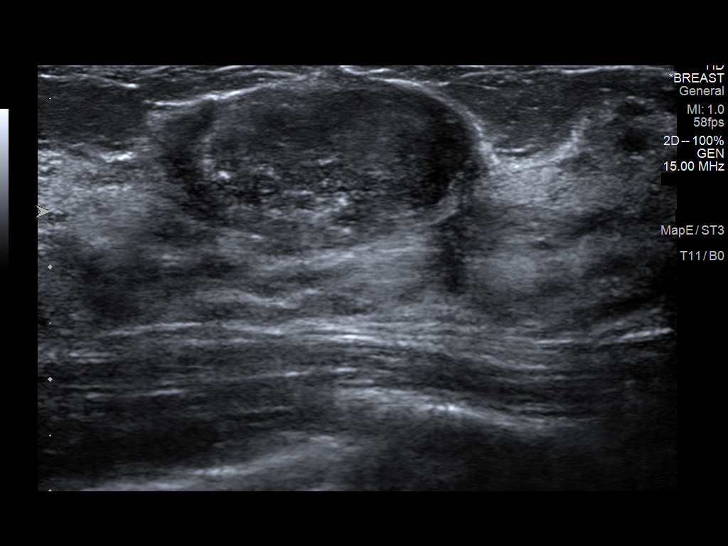
[im 3/10]
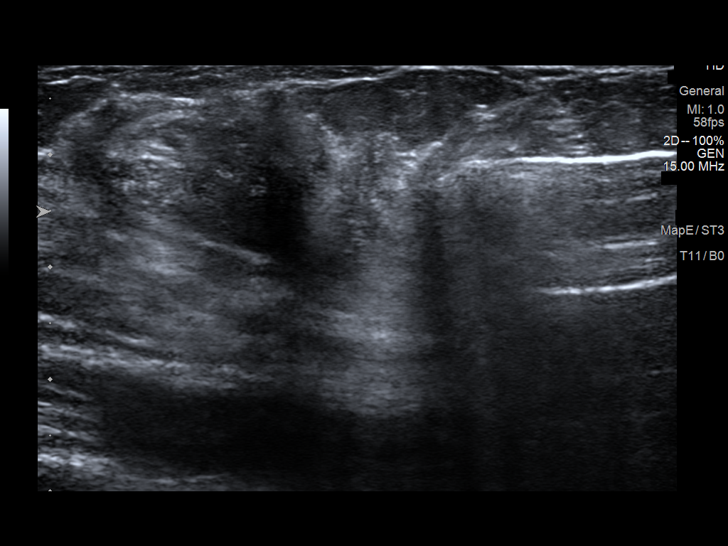
[im 4/10]
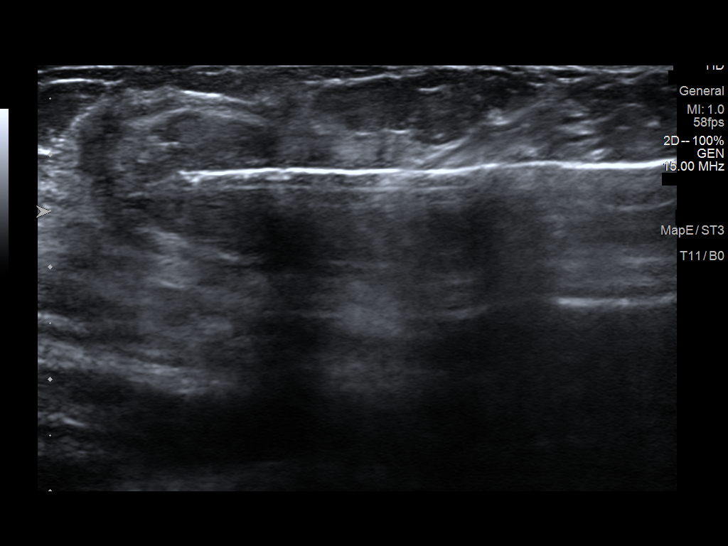
[im 5/10]
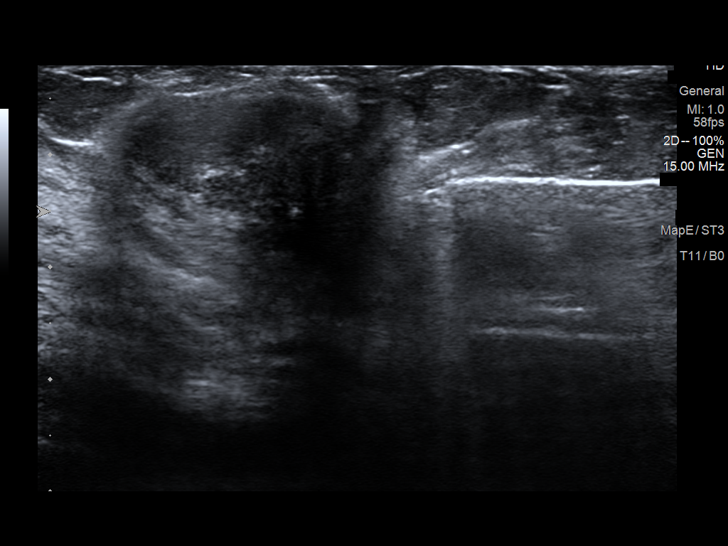
[im 6/10]
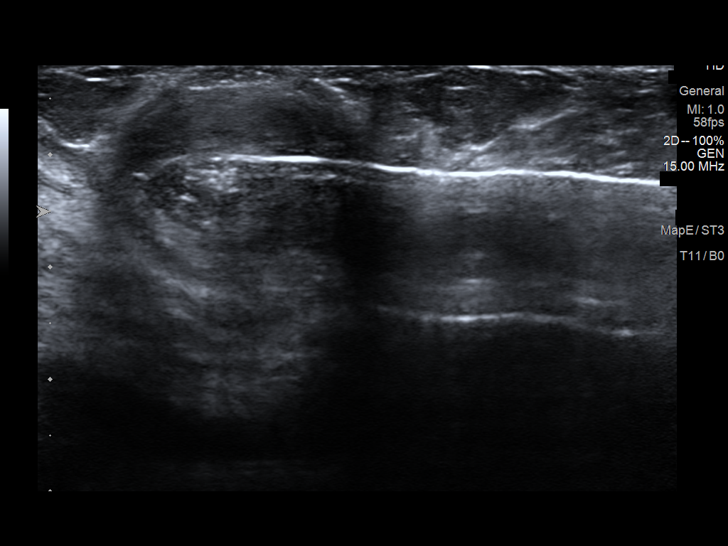
[im 7/10]
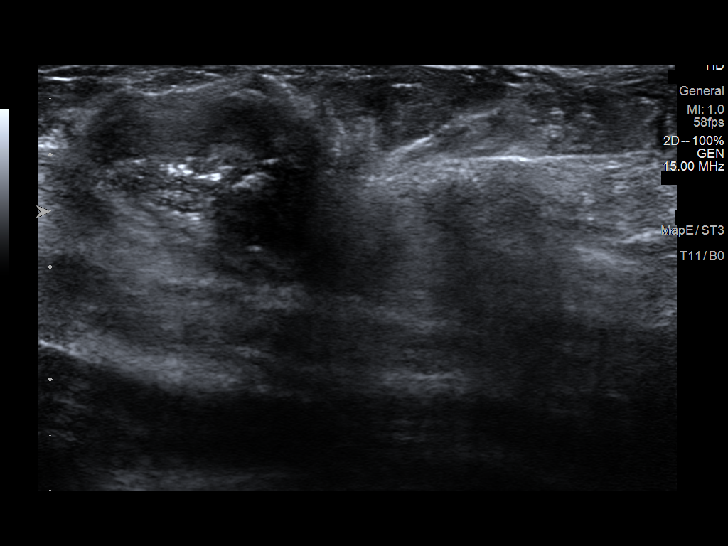
[im 8/10]
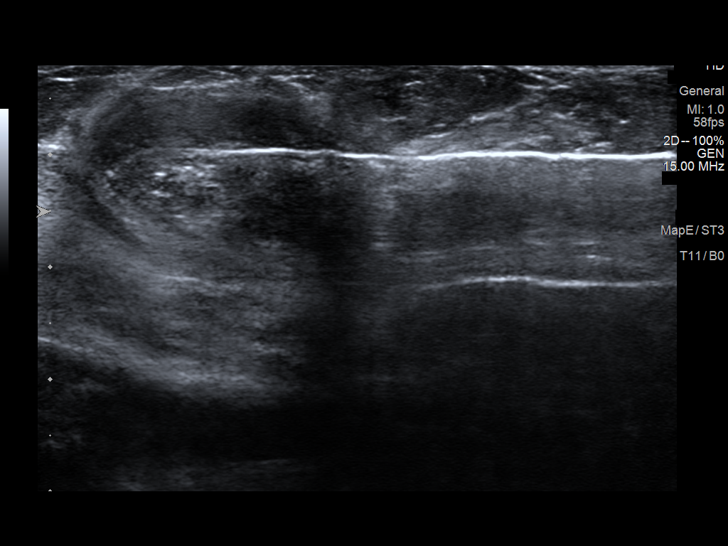
[im 9/10]
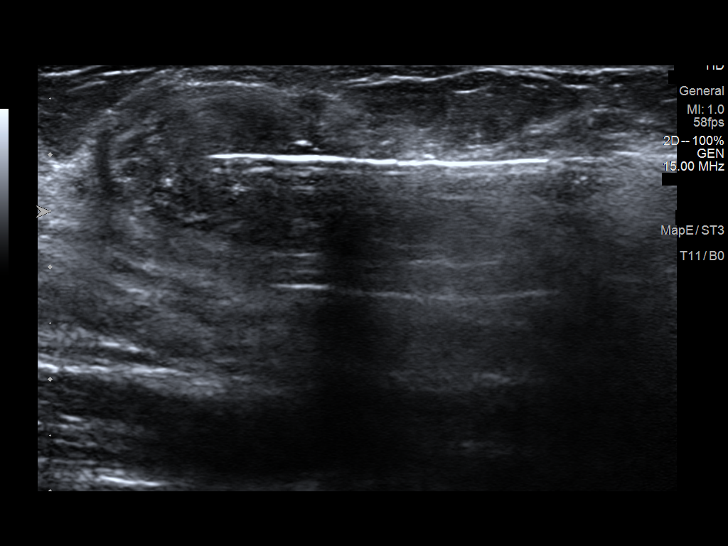
[im 10/10]
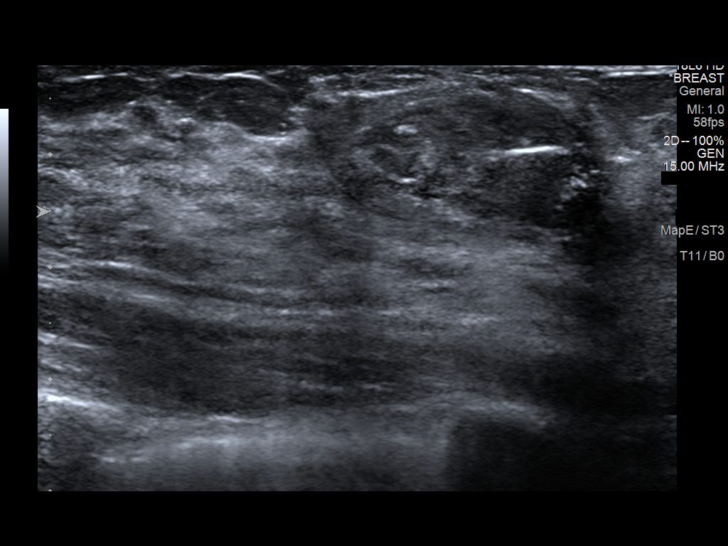

[10 of 10 positions shown; findings below may reference images not displayed]



Lesion quadrant: RIGHT breast 9 o'clock

Using sterile technique and 1% Lidocaine as local anesthetic, under
direct ultrasound visualization, a 14 gauge Modra device was
used to perform biopsy of mass in the 9 o'clock location of the
RIGHT breast using a inferior to superior approach. At the
conclusion of the procedure a spiral shaped HydroMARK tissue marker
clip was deployed into the biopsy cavity.
IMPRESSION: Ultrasound guided biopsy of RIGHT breast mass. No apparent
complications.

ADDENDUM:
Pathology revealed FIBROADENOMA of the RIGHT breast, 9 o'clock,
(coil clip). This was found to be concordant by Dr. Feteneabtamu Jinuit.

Pathology results were discussed with the patient by telephone by
Z Yallah Simagn, [REDACTED] at [facility] [REDACTED]. The patient reported doing well after the biopsy with
tenderness at the site.

The patient was instructed to continue with monthly self breast
examinations, clinical follow-up as needed, and to return for annual
mammography at 40.

Pathology results reported by Klpigbb Moolman, RN on 12/26/2017.

*** End of Addendum ***

## 2020-04-18 ENCOUNTER — Other Ambulatory Visit: Payer: Self-pay

## 2020-04-18 ENCOUNTER — Other Ambulatory Visit (HOSPITAL_COMMUNITY)
Admission: RE | Admit: 2020-04-18 | Discharge: 2020-04-18 | Disposition: A | Discharge status: Home / Self Care | Payer: PRIVATE HEALTH INSURANCE | Source: Ambulatory Visit | Attending: Obstetrics and Gynecology | Admitting: Obstetrics and Gynecology

## 2020-04-18 ENCOUNTER — Ambulatory Visit (INDEPENDENT_AMBULATORY_CARE_PROVIDER_SITE_OTHER): Payer: 59 | Admitting: Obstetrics and Gynecology

## 2020-04-18 ENCOUNTER — Other Ambulatory Visit (HOSPITAL_COMMUNITY): Payer: Self-pay | Admitting: Obstetrics and Gynecology

## 2020-04-18 ENCOUNTER — Encounter: Payer: Self-pay | Admitting: Obstetrics and Gynecology

## 2020-04-18 VITALS — BP 110/70 | HR 90 | Ht 62.75 in | Wt 126.0 lb

## 2020-04-18 DIAGNOSIS — Z3041 Encounter for surveillance of contraceptive pills: Secondary | ICD-10-CM

## 2020-04-18 DIAGNOSIS — Z01419 Encounter for gynecological examination (general) (routine) without abnormal findings: Secondary | ICD-10-CM | POA: Insufficient documentation

## 2020-04-18 DIAGNOSIS — Z113 Encounter for screening for infections with a predominantly sexual mode of transmission: Secondary | ICD-10-CM

## 2020-04-18 LAB — CBC
MCH: 29.9 pg (ref 27.0–33.0)
MPV: 9.2 fL (ref 7.5–12.5)
RBC: 5.25 10*6/uL — ABNORMAL HIGH (ref 3.80–5.10)

## 2020-04-18 MED ORDER — LARIN 24 FE 1-20 MG-MCG(24) PO TABS: 1.0000 | ORAL_TABLET | Freq: Every day | ORAL | 3 refills | Status: DC

## 2020-04-18 NOTE — Patient Instructions (Signed)

## 2020-04-18 NOTE — Progress Notes (Signed)
25 y.o. G55P0000 Single Caucasian female here for annual exam.    Wants refill of her birth control.  No period problems.   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   Steady partner for 7 years.   Received her Covid booster.  PCP:   NONE  Patient's last menstrual period was 04/17/2020.           Sexually active: Yes.    The current method of family planning is OCP (estrogen/progesterone).    Exercising: Yes.    gym Smoker:  no  Health Maintenance: Pap:  12/14/16 Neg History of abnormal Pap:  no TDaP:  01/17/2019 Gardasil:   Completed series HIV: never Hep C: never Screening Labs:  PCP   reports that she has never smoked. She has never used smokeless tobacco. She reports current alcohol use of about 2.0 standard drinks of alcohol per week. She reports that she does not use drugs.  Past Medical History:  Diagnosis Date  . COVID 08/2019    Past Surgical History:  Procedure Laterality Date  . BREAST SURGERY  12/23/2017   right breast biopsy - benign fibroadenoma  . WISDOM TOOTH EXTRACTION      Current Outpatient Medications  Medication Sig Dispense Refill  . Norethindrone Acetate-Ethinyl Estrad-FE (LARIN 24 FE) 1-20 MG-MCG(24) tablet Take 1 tablet by mouth daily. 84 tablet 3   No current facility-administered medications for this visit.    Family History  Problem Relation Age of Onset  . Diabetes Father   . Heart attack Father   . Hyperlipidemia Father   . Hypertension Father   . Hypertension Maternal Grandmother   . Hyperlipidemia Maternal Grandmother   . Diabetes Maternal Grandmother     Review of Systems  Constitutional: Negative.   HENT: Negative.   Eyes: Negative.   Respiratory: Negative.   Cardiovascular: Negative.   Gastrointestinal: Negative.   Endocrine: Negative.   Genitourinary: Negative.   Musculoskeletal: Negative.   Skin: Negative.   Allergic/Immunologic: Negative.   Neurological: Negative.    Hematological: Negative.   Psychiatric/Behavioral: Negative.     Exam:   BP 110/70   Pulse 90   Ht 5' 2.75" (1.594 m)   Wt 126 lb (57.2 kg)   LMP 04/17/2020   SpO2 99%   BMI 22.50 kg/m     General appearance: alert, cooperative and appears stated age Head: normocephalic, without obvious abnormality, atraumatic Neck: no adenopathy, supple, symmetrical, trachea midline and thyroid normal to inspection and palpation Lungs: clear to auscultation bilaterally Breasts: left - normal appearance, no masses or tenderness, No nipple retraction or dimpling, No nipple discharge or bleeding, No axillary adenopathy Right - normal appearance, 3.5 cm mass at 10:00, nontender. No nipple retraction or dimpling, No nipple discharge or bleeding, No axillary adenopathy Heart: regular rate and rhythm Abdomen: soft, non-tender; no masses, no organomegaly Extremities: extremities normal, atraumatic, no cyanosis or edema Skin: skin color, texture, turgor normal. No rashes or lesions Lymph nodes: cervical, supraclavicular, and axillary nodes normal. Neurologic: grossly normal  Pelvic: External genitalia:  no lesions              No abnormal inguinal nodes palpated.              Urethra:  normal appearing urethra with no masses, tenderness or lesions              Bartholins and Skenes: normal  Vagina: normal appearing vagina with normal color and discharge, no lesions              Cervix: no lesions              Pap taken: Yes.   Bimanual Exam:  Uterus:  normal size, contour, position, consistency, mobility, non-tender              Adnexa: no mass, fullness, tenderness              Chaperone was present for exam.  Assessment:   Well woman visit with normal exam. Right breast fibroadenoma.  Plan: Mammogram screening age 28.  Self breast awareness reviewed. Pap and HR HPV as above.  GC/CT from pap.  Guidelines for Calcium, Vitamin D, regular exercise program including cardiovascular  and weight bearing exercise. Routine labs.  She declines serum STD screening.  Refill of COCs for one year.  Follow up annually and prn.   After visit summary provided.

## 2020-04-19 LAB — COMPREHENSIVE METABOLIC PANEL
AG Ratio: 1.8 (calc) (ref 1.0–2.5)
ALT: 14 U/L (ref 6–29)
AST: 17 U/L (ref 10–30)
Albumin: 4.3 g/dL (ref 3.6–5.1)
Alkaline phosphatase (APISO): 52 U/L (ref 31–125)
BUN: 17 mg/dL (ref 7–25)
CO2: 21 mmol/L (ref 20–32)
Calcium: 9.5 mg/dL (ref 8.6–10.2)
Chloride: 104 mmol/L (ref 98–110)
Creat: 0.71 mg/dL (ref 0.50–1.10)
Globulin: 2.4 g/dL (calc) (ref 1.9–3.7)
Glucose, Bld: 80 mg/dL (ref 65–99)
Potassium: 4.5 mmol/L (ref 3.5–5.3)
Sodium: 138 mmol/L (ref 135–146)
Total Bilirubin: 0.4 mg/dL (ref 0.2–1.2)
Total Protein: 6.7 g/dL (ref 6.1–8.1)

## 2020-04-19 LAB — CBC
HCT: 46.2 % — ABNORMAL HIGH (ref 35.0–45.0)
Hemoglobin: 15.7 g/dL — ABNORMAL HIGH (ref 11.7–15.5)
MCHC: 34 g/dL (ref 32.0–36.0)
MCV: 88 fL (ref 80.0–100.0)
Platelets: 749 10*3/uL — ABNORMAL HIGH (ref 140–400)
RDW: 12.7 % (ref 11.0–15.0)
WBC: 10 10*3/uL (ref 3.8–10.8)

## 2020-04-19 LAB — LIPID PANEL
Cholesterol: 166 mg/dL (ref <200)
HDL: 61 mg/dL (ref >=50)
LDL Cholesterol (Calc): 86 mg/dL (calc)
Non-HDL Cholesterol (Calc): 105 mg/dL (calc) (ref <130)
Total CHOL/HDL Ratio: 2.7 (calc) (ref <5.0)
Triglycerides: 99 mg/dL (ref <150)

## 2020-04-22 LAB — CYTOLOGY - PAP
Chlamydia: NEGATIVE
Comment: NEGATIVE
Comment: NORMAL
Diagnosis: NEGATIVE
Neisseria Gonorrhea: NEGATIVE

## 2020-04-25 ENCOUNTER — Encounter: Payer: Self-pay | Admitting: Obstetrics and Gynecology

## 2020-04-25 ENCOUNTER — Telehealth: Payer: Self-pay | Admitting: *Deleted

## 2020-04-25 DIAGNOSIS — R7989 Other specified abnormal findings of blood chemistry: Secondary | ICD-10-CM

## 2020-04-25 DIAGNOSIS — D582 Other hemoglobinopathies: Secondary | ICD-10-CM

## 2020-04-25 NOTE — Telephone Encounter (Signed)
Her platelet count and hemoglobin are elevated, and I recommend evaluation with a hematologist to understand this further. Please place a referral for first available hematology appointment the [facility].    Referral placed at the [facility] they will call to schedule.

## 2020-05-05 NOTE — Telephone Encounter (Signed)
Patient scheduled on 05/12/20 with [place], Holli Humbles, NP

## 2020-05-11 ENCOUNTER — Other Ambulatory Visit: Payer: Self-pay | Admitting: Family

## 2020-05-11 DIAGNOSIS — Z1589 Genetic susceptibility to other disease: Secondary | ICD-10-CM

## 2020-05-11 DIAGNOSIS — D751 Secondary polycythemia: Secondary | ICD-10-CM

## 2020-05-11 DIAGNOSIS — D75839 Thrombocytosis, unspecified: Secondary | ICD-10-CM

## 2020-05-12 ENCOUNTER — Inpatient Hospital Stay: Payer: 59 | Attending: Family

## 2020-05-12 ENCOUNTER — Inpatient Hospital Stay (HOSPITAL_BASED_OUTPATIENT_CLINIC_OR_DEPARTMENT_OTHER): Payer: 59 | Admitting: Family

## 2020-05-12 ENCOUNTER — Encounter: Payer: Self-pay | Admitting: Family

## 2020-05-12 ENCOUNTER — Other Ambulatory Visit: Payer: Self-pay

## 2020-05-12 VITALS — BP 136/78 | HR 96 | Temp 97.8°F | Resp 18 | Ht 62.0 in | Wt 129.0 lb

## 2020-05-12 DIAGNOSIS — D75839 Thrombocytosis, unspecified: Secondary | ICD-10-CM | POA: Diagnosis not present

## 2020-05-12 DIAGNOSIS — D751 Secondary polycythemia: Secondary | ICD-10-CM

## 2020-05-12 DIAGNOSIS — Z1589 Genetic susceptibility to other disease: Secondary | ICD-10-CM

## 2020-05-12 DIAGNOSIS — D45 Polycythemia vera: Secondary | ICD-10-CM

## 2020-05-12 DIAGNOSIS — R1900 Intra-abdominal and pelvic swelling, mass and lump, unspecified site: Secondary | ICD-10-CM

## 2020-05-12 DIAGNOSIS — R161 Splenomegaly, not elsewhere classified: Secondary | ICD-10-CM

## 2020-05-12 LAB — CBC WITH DIFFERENTIAL (CANCER CENTER ONLY)
Abs Immature Granulocytes: 0.16 10*3/uL — ABNORMAL HIGH (ref 0.00–0.07)
Basophils Absolute: 0.1 10*3/uL (ref 0.0–0.1)
Basophils Relative: 1 %
Eosinophils Absolute: 0.5 10*3/uL (ref 0.0–0.5)
Eosinophils Relative: 4 %
HCT: 44.3 % (ref 36.0–46.0)
Hemoglobin: 15.2 g/dL — ABNORMAL HIGH (ref 12.0–15.0)
Immature Granulocytes: 1 %
Lymphocytes Relative: 22 %
Lymphs Abs: 2.7 10*3/uL (ref 0.7–4.0)
MCH: 29 pg (ref 26.0–34.0)
MCHC: 34.3 g/dL (ref 30.0–36.0)
MCV: 84.4 fL (ref 80.0–100.0)
Monocytes Absolute: 0.7 10*3/uL (ref 0.1–1.0)
Monocytes Relative: 5 %
Neutro Abs: 8.3 10*3/uL — ABNORMAL HIGH (ref 1.7–7.7)
Neutrophils Relative %: 67 %
Platelet Count: 718 10*3/uL — ABNORMAL HIGH (ref 150–400)
RBC: 5.25 MIL/uL — ABNORMAL HIGH (ref 3.87–5.11)
RDW: 12.7 % (ref 11.5–15.5)
WBC Count: 12.4 10*3/uL — ABNORMAL HIGH (ref 4.0–10.5)
nRBC: 0 % (ref 0.0–0.2)

## 2020-05-12 LAB — CMP (CANCER CENTER ONLY)
ALT: 12 U/L (ref 0–44)
AST: 14 U/L — ABNORMAL LOW (ref 15–41)
Albumin: 4.4 g/dL (ref 3.5–5.0)
Alkaline Phosphatase: 51 U/L (ref 38–126)
Anion gap: 10 (ref 5–15)
BUN: 13 mg/dL (ref 6–20)
CO2: 25 mmol/L (ref 22–32)
Calcium: 9.7 mg/dL (ref 8.9–10.3)
Chloride: 102 mmol/L (ref 98–111)
Creatinine: 0.71 mg/dL (ref 0.44–1.00)
GFR, Estimated: >60 mL/min (ref >60)
Glucose, Bld: 129 mg/dL — ABNORMAL HIGH (ref 70–99)
Potassium: 3.4 mmol/L — ABNORMAL LOW (ref 3.5–5.1)
Sodium: 137 mmol/L (ref 135–145)
Total Bilirubin: 0.5 mg/dL (ref 0.3–1.2)
Total Protein: 6.7 g/dL (ref 6.5–8.1)

## 2020-05-12 LAB — RETICULOCYTES
Immature Retic Fract: 6.3 % (ref 2.3–15.9)
RBC.: 5.3 MIL/uL — ABNORMAL HIGH (ref 3.87–5.11)
Retic Count, Absolute: 77.9 10*3/uL (ref 19.0–186.0)
Retic Ct Pct: 1.5 % (ref 0.4–3.1)

## 2020-05-12 LAB — LACTATE DEHYDROGENASE: LDH: 226 U/L — ABNORMAL HIGH (ref 98–192)

## 2020-05-12 LAB — SAVE SMEAR(SSMR), FOR PROVIDER SLIDE REVIEW

## 2020-05-12 NOTE — Progress Notes (Signed)
Hematology/Oncology Consultation   Name: Roshawn Lacina      MRN: [id number]    Location: Room/bed info not found  Date: 05/12/2020 Time:4:33 PM   REFERRING PHYSICIAN: Brook A. Quincy Simmonds, MD  REASON FOR CONSULT: Elevated Hgb and elevated platelet count    DIAGNOSIS: Erythrocytosis and thrombocytosis   HISTORY OF PRESENT ILLNESS: Ms. Debruyn is a very pleasant 25 yo caucasian female with erythrocytosis and thrombocytosis noted on recent lab work with gynecologist. She states that she had not had a CBC prior to that point to compare.  Today Hgb is 15.2, WBC count is 12.4 and platelets 718.  She denies fatigue and has remained asymptomatic.  No adenopathy on lymphedema noted.  No history of pregnancy or miscarriage.  She describes her cycle as regular with normal flow. She is currently on an estrogen based birth control.  No abnormal blood loss. No bruising or petechiae.  No personal or known familial history of thrombus.  Her only past surgery was wisdom teeth extraction. She states that this went well without any complications.  No history of diabetes or thyroid disease.  Familial history of cancer includes: paternal grandfather - multiple myeloma and maternal grandfather - prostate.  Her father has history of diabetes, heart attack and high cholesterol.  Her maternal grandmother had history of TIAs.  She did have Covid in July 2021.  Patient denies fever, chills, n/v, cough, rash, dizziness, SOB, chest pain, palpitations, abdominal pain/bloating or changes in bowel or bladder habits.  She had a persistent headache for about 3 weeks but this resolved on 3/31 and she has had no other episodes. She feels this may have been due to sinus pressure/allergies.  No hot flashes or night sweats. No swelling, tenderness, numbness or tingling in her extremities.  No falls or syncope.  She has maintained a good appetite and is staying well hydrated. Her weight is stable at 129 lbs.  No smoking, ETOH or  recreational drug use.  She is quite active going to the gym regularly.  She works as a Astronomer at [employer].   ROS: All other 10 point review of systems is negative.   PAST MEDICAL HISTORY:   Past Medical History:  Diagnosis Date  . COVID 08/2019  . Thrombocytosis 2022    ALLERGIES: No Known Allergies    MEDICATIONS:  Current Outpatient Medications on File Prior to Visit  Medication Sig Dispense Refill  . Norethindrone Acetate-Ethinyl Estrad-FE (LARIN 24 FE) 1-20 MG-MCG(24) tablet Take 1 tablet by mouth daily. 84 tablet 3  . Norethindrone Acetate-Ethinyl Estrad-FE (LOESTRIN 24 FE) 1-20 MG-MCG(24) tablet TAKE 1 TABLET BY MOUTH ONCE DAILY. 84 tablet 3   No current facility-administered medications on file prior to visit.     PAST SURGICAL HISTORY Past Surgical History:  Procedure Laterality Date  . BREAST SURGERY  12/23/2017   right breast biopsy - benign fibroadenoma  . WISDOM TOOTH EXTRACTION      FAMILY HISTORY: Family History  Problem Relation Age of Onset  . Diabetes Father   . Heart attack Father   . Hyperlipidemia Father   . Hypertension Father   . Hypertension Maternal Grandmother   . Hyperlipidemia Maternal Grandmother   . Diabetes Maternal Grandmother     SOCIAL HISTORY:  reports that she has never smoked. She has never used smokeless tobacco. She reports current alcohol use of about 2.0 standard drinks of alcohol per week. She reports that she does not use drugs.  PERFORMANCE STATUS: The patient's performance  status is 1 - Symptomatic but completely ambulatory  PHYSICAL EXAM: Most Recent Vital Signs: Blood pressure 136/78, pulse 96, temperature 97.8 F (36.6 C), temperature source Oral, resp. rate 18, height '5\' 2"'  (1.575 m), weight 129 lb (58.5 kg), last menstrual period 04/17/2020, SpO2 100 %. BP 136/78 (BP Location: Left Arm, Patient Position: Sitting)   Pulse 96   Temp 97.8 F (36.6 C) (Oral)   Resp 18   Ht '5\' 2"'  (1.575 m)   Wt 129 lb  (58.5 kg)   LMP 04/17/2020   SpO2 100%   BMI 23.59 kg/m   General Appearance:    Alert, cooperative, no distress, appears stated age  Head:    Normocephalic, without obvious abnormality, atraumatic  Eyes:    PERRL, conjunctiva/corneas clear, EOM's intact, fundi    benign, both eyes        Throat:   Lips, mucosa, and tongue normal; teeth and gums normal  Neck:   Supple, symmetrical, trachea midline, no adenopathy;    thyroid:  no enlargement/tenderness/nodules; no carotid   bruit or JVD  Back:     Symmetric, no curvature, ROM normal, no CVA tenderness  Lungs:     Clear to auscultation bilaterally, respirations unlabored  Chest Wall:    No tenderness or deformity   Heart:    Regular rate and rhythm, S1 and S2 normal, no murmur, rub   or gallop     Abdomen:     Soft, non-tender, bowel sounds active all four quadrants,    no masses, no organomegaly        Extremities:   Extremities normal, atraumatic, no cyanosis or edema  Pulses:   2+ and symmetric all extremities  Skin:   Skin color, texture, turgor normal, no rashes or lesions  Lymph nodes:   Cervical, supraclavicular, and axillary nodes normal  Neurologic:   CNII-XII intact, normal strength, sensation and reflexes    throughout    LABORATORY DATA:  Results for orders placed or performed in visit on 05/12/20 (from the past 48 hour(s))  Save Smear (SSMR)     Status: None   Collection Time: 05/12/20  2:28 PM  Result Value Ref Range   Smear Review SMEAR STAINED AND AVAILABLE FOR REVIEW     Comment: Performed at [facility] Lab at [facility], [address] [place], [place] [address]  Lactate dehydrogenase (LDH)     Status: Abnormal   Collection Time: 05/12/20  2:28 PM  Result Value Ref Range   LDH 226 (H) 98 - 192 U/L    Comment: Performed at [facility] Lab at [facility], [address], [place], [place] [address]  CMP ([place] only)     Status: Abnormal   Collection  Time: 05/12/20  2:28 PM  Result Value Ref Range   Sodium 137 135 - 145 mmol/L   Potassium 3.4 (L) 3.5 - 5.1 mmol/L   Chloride 102 98 - 111 mmol/L   CO2 25 22 - 32 mmol/L   Glucose, Bld 129 (H) 70 - 99 mg/dL    Comment: Glucose reference range applies only to samples taken after fasting for at least 8 hours.   BUN 13 6 - 20 mg/dL   Creatinine 0.71 0.44 - 1.00 mg/dL   Calcium 9.7 8.9 - 10.3 mg/dL   Total Protein 6.7 6.5 - 8.1 g/dL   Albumin 4.4 3.5 - 5.0 g/dL   AST 14 (L) 15 - 41  U/L   ALT 12 0 - 44 U/L   Alkaline Phosphatase 51 38 - 126 U/L   Total Bilirubin 0.5 0.3 - 1.2 mg/dL   GFR, Estimated >60 >60 mL/min    Comment: (NOTE) Calculated using the CKD-EPI Creatinine Equation (2021)    Anion gap 10 5 - 15    Comment: Performed at [facility] Lab at [facility], [address], [place], [place] [address]  Reticulocytes     Status: Abnormal   Collection Time: 05/12/20  2:28 PM  Result Value Ref Range   Retic Ct Pct 1.5 0.4 - 3.1 %   RBC. 5.30 (H) 3.87 - 5.11 MIL/uL   Retic Count, Absolute 77.9 19.0 - 186.0 K/uL   Immature Retic Fract 6.3 2.3 - 15.9 %    Comment: Performed at [facility] Lab at [facility], [address], [place], [place] [address]  CBC with Differential ([place] Only)     Status: Abnormal   Collection Time: 05/12/20  2:28 PM  Result Value Ref Range   WBC Count 12.4 (H) 4.0 - 10.5 K/uL   RBC 5.25 (H) 3.87 - 5.11 MIL/uL   Hemoglobin 15.2 (H) 12.0 - 15.0 g/dL   HCT 44.3 36.0 - 46.0 %   MCV 84.4 80.0 - 100.0 fL   MCH 29.0 26.0 - 34.0 pg   MCHC 34.3 30.0 - 36.0 g/dL   RDW 12.7 11.5 - 15.5 %   Platelet Count 718 (H) 150 - 400 K/uL   nRBC 0.0 0.0 - 0.2 %   Neutrophils Relative % 67 %   Neutro Abs 8.3 (H) 1.7 - 7.7 K/uL   Lymphocytes Relative 22 %   Lymphs Abs 2.7 0.7 - 4.0 K/uL   Monocytes Relative 5 %   Monocytes Absolute 0.7 0.1 - 1.0 K/uL   Eosinophils Relative 4 %   Eosinophils Absolute 0.5 0.0 - 0.5  K/uL   Basophils Relative 1 %   Basophils Absolute 0.1 0.0 - 0.1 K/uL   Immature Granulocytes 1 %   Abs Immature Granulocytes 0.16 (H) 0.00 - 0.07 K/uL    Comment: Performed at [facility] Lab at [facility], [address], [place], [place] [address]      RADIOGRAPHY: No results found.     PATHOLOGY: None   ASSESSMENT/PLAN: Ms. Pecore is a very pleasant 25 yo caucasian female with erythrocytosis and marked thrombocytosis noted on recent routine lab work suspicious for polycythemia.  She also has an increase in her WBC count today as well.  JAK 2, iron studies and erythropoietin level pending.  We will have her go ahead and stop her birth control as it contains estrogen and increases her risk of developing a blood clot. We discussed her switching to a progesterone based contraception and she verbalized understanding. I will forward today's note to her referring gynecologist so that she is also aware.  We will get an [place] today to assess her liver and spleen.  We will go ahead and plan to follow-up in 4 weeks.   All questions were answered and she is in agreement with the plan. She can contact our office with any questions or concerns.   The patient was discussed with Dr. Marin Olp and he is in agreement with the aforementioned.   Laverna Peace, NP

## 2020-05-13 ENCOUNTER — Telehealth: Payer: Self-pay | Admitting: *Deleted

## 2020-05-13 ENCOUNTER — Telehealth: Payer: Self-pay | Admitting: Obstetrics and Gynecology

## 2020-05-13 LAB — IRON AND TIBC
Iron: 86 ug/dL (ref 41–142)
Saturation Ratios: 21 % (ref 21–57)
TIBC: 408 ug/dL (ref 236–444)
UIBC: 322 ug/dL (ref 120–384)

## 2020-05-13 LAB — FERRITIN: Ferritin: 118 ng/mL (ref 11–307)

## 2020-05-13 LAB — ERYTHROPOIETIN: Erythropoietin: 2.6 m[IU]/mL (ref 2.6–18.5)

## 2020-05-13 NOTE — Telephone Encounter (Signed)
Please contact patient regarding her contraception.   She just saw hematology, and received a recommendation to stop her combined oral contraceptive due to her elevated hemoglobin and platelets, which increase her risk of developing a blood clot.   Please offer her an appointment with me to discuss contraceptive choices.

## 2020-05-13 NOTE — Telephone Encounter (Signed)
No los 05/12/20 to schedule

## 2020-05-13 NOTE — Telephone Encounter (Signed)
Call to patient. Message given to patient as seen below from Dr. Quincy Simmonds. Patient agreeable to schedule. Patient scheduled for consult on 05-19-20 at 1330. Patient agreeable to date and time of appointment.   Encounter closed.

## 2020-05-15 ENCOUNTER — Telehealth: Payer: Self-pay

## 2020-05-15 ENCOUNTER — Other Ambulatory Visit: Payer: Self-pay

## 2020-05-15 ENCOUNTER — Other Ambulatory Visit (HOSPITAL_COMMUNITY): Payer: Self-pay

## 2020-05-15 ENCOUNTER — Ambulatory Visit (HOSPITAL_BASED_OUTPATIENT_CLINIC_OR_DEPARTMENT_OTHER)
Admission: RE | Admit: 2020-05-15 | Discharge: 2020-05-15 | Disposition: A | Discharge status: Home / Self Care | Payer: 59 | Source: Ambulatory Visit | Attending: Family | Admitting: Family

## 2020-05-15 DIAGNOSIS — D45 Polycythemia vera: Secondary | ICD-10-CM | POA: Insufficient documentation

## 2020-05-15 DIAGNOSIS — R161 Splenomegaly, not elsewhere classified: Secondary | ICD-10-CM | POA: Diagnosis not present

## 2020-05-15 DIAGNOSIS — R1900 Intra-abdominal and pelvic swelling, mass and lump, unspecified site: Secondary | ICD-10-CM | POA: Diagnosis not present

## 2020-05-15 MED ORDER — NORETHINDRONE 0.35 MG PO TABS
1.0000 | ORAL_TABLET | Freq: Every day | ORAL | 3 refills | Status: DC
  Filled 2020-05-15: qty 84, 84d supply, fill #0
  Filled 2020-08-06: qty 84, 84d supply, fill #1
  Filled 2020-10-27: qty 84, 84d supply, fill #2
  Filled 2021-01-27: qty 84, 84d supply, fill #3

## 2020-05-15 NOTE — Telephone Encounter (Signed)
I spoke with patient and informed her regarding Dr. Elza Rafter instructions/recommendation. Rx sent.

## 2020-05-15 NOTE — Telephone Encounter (Signed)
Patient was referred by Dr. Quincy Simmonds to hematology.  She met with them on 05/12/20. At that visit it was noted that she needed to d/c her bcps and a progesterone based birth control pill was recommended.  OFFICE NOTE FROM THAT VISIT:  "We will have her go ahead and stop her birth control as it contains estrogen and increases her risk of developing a blood clot. We discussed her switching to a progesterone based contraception and she verbalized understanding. I will forward today's note to her referring gynecologist so that she is also aware"  Patient is asking if you will just prescribe this for her without requiring an office visit. 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

## 2020-05-15 NOTE — Telephone Encounter (Signed)
Ok for patient to stop her combined oral contraception and start Micronor progesterone only pills.  She needs to take the pills on time every day and only has a 3 hour window of time to do this.  There are no sugar pills at the end of the pack.  She can have refills until her annual exam is due.   Have her use back up contraception for at least one week.

## 2020-05-16 ENCOUNTER — Telehealth: Payer: Self-pay | Admitting: *Deleted

## 2020-05-16 NOTE — Telephone Encounter (Signed)
Patient notified per order of S. [place] NP that the "US showed just mildly enlarged spleen which was expected.  Her liver looks good! We are still waiting for her JAK 2 testing to come back.  Thank you!!!"  Pt very appreciative of call and has no questions at this time.

## 2020-05-16 NOTE — Telephone Encounter (Signed)
-----   Message from Eliezer Bottom, NP sent at 05/16/2020  1:59 PM EDT ----- US showed just mildly enlarged spleen which was expected. Her liver looks good! We are still waiting for her JAK 2 testing to come back. Thank you!!!!!   ----- Message ----- From: Interface, Rad Results In Sent: 05/15/2020   5:31 PM EDT To: Eliezer Bottom, NP

## 2020-05-19 ENCOUNTER — Telehealth: Payer: Self-pay | Admitting: *Deleted

## 2020-05-19 ENCOUNTER — Ambulatory Visit: Payer: 59 | Admitting: Obstetrics and Gynecology

## 2020-05-19 NOTE — Telephone Encounter (Signed)
Per 05/12/20 los - called patient and gave upcoming appointments - mailed calendar

## 2020-05-19 NOTE — Progress Notes (Deleted)
GYNECOLOGY  VISIT   HPI: 25 y.o.   Single  Caucasian  female   G0P0000 with No LMP recorded.   here for    GYNECOLOGIC HISTORY: No LMP recorded. Contraception:  Micronor  Menopausal hormone therapy:  n/a Last mammogram:  n/a Last pap smear: 04-18-20 Neg, 12/14/16 Neg        OB History    Gravida  0   Para  0   Term  0   Preterm  0   AB  0   Living  0     SAB  0   IAB  0   Ectopic  0   Multiple  0   Live Births                 There are no problems to display for this patient.   Past Medical History:  Diagnosis Date  . COVID 08/2019  . Thrombocytosis 2022    Past Surgical History:  Procedure Laterality Date  . BREAST SURGERY  12/23/2017   right breast biopsy - benign fibroadenoma  . WISDOM TOOTH EXTRACTION      Current Outpatient Medications  Medication Sig Dispense Refill  . norethindrone (MICRONOR) 0.35 MG tablet Take 1 tablet (0.35 mg total) by mouth daily. 84 tablet 3  . Norethindrone Acetate-Ethinyl Estrad-FE (LARIN 24 FE) 1-20 MG-MCG(24) tablet Take 1 tablet by mouth daily. 84 tablet 3  . Norethindrone Acetate-Ethinyl Estrad-FE (LOESTRIN 24 FE) 1-20 MG-MCG(24) tablet TAKE 1 TABLET BY MOUTH ONCE DAILY. 84 tablet 3   No current facility-administered medications for this visit.     ALLERGIES: Patient has no known allergies.  Family History  Problem Relation Age of Onset  . Diabetes Father   . Heart attack Father   . Hyperlipidemia Father   . Hypertension Father   . Hypertension Maternal Grandmother   . Hyperlipidemia Maternal Grandmother   . Diabetes Maternal Grandmother     Social History   Socioeconomic History  . Marital status: Single    Spouse name: Not on file  . Number of children: Not on file  . Years of education: Not on file  . Highest education level: Not on file  Occupational History  . Not on file  Tobacco Use  . Smoking status: Never Smoker  . Smokeless tobacco: Never Used  Vaping Use  . Vaping Use: Never  used  Substance and Sexual Activity  . Alcohol use: Yes    Alcohol/week: 2.0 standard drinks    Types: 2 Standard drinks or equivalent per week  . Drug use: No  . Sexual activity: Yes    Partners: Male    Birth control/protection: OCP  Other Topics Concern  . Not on file  Social History Narrative  . Not on file   Social Determinants of Health   Financial Resource Strain: Not on file  Food Insecurity: Not on file  Transportation Needs: Not on file  Physical Activity: Not on file  Stress: Not on file  Social Connections: Not on file  Intimate Partner Violence: Not on file    Review of Systems  PHYSICAL EXAMINATION:    There were no vitals taken for this visit.    General appearance: alert, cooperative and appears stated age Head: Normocephalic, without obvious abnormality, atraumatic Neck: no adenopathy, supple, symmetrical, trachea midline and thyroid normal to inspection and palpation Lungs: clear to auscultation bilaterally Breasts: normal appearance, no masses or tenderness, No nipple retraction or dimpling, No nipple discharge or bleeding, No  axillary or supraclavicular adenopathy Heart: regular rate and rhythm Abdomen: soft, non-tender, no masses,  no organomegaly Extremities: extremities normal, atraumatic, no cyanosis or edema Skin: Skin color, texture, turgor normal. No rashes or lesions Lymph nodes: Cervical, supraclavicular, and axillary nodes normal. No abnormal inguinal nodes palpated Neurologic: Grossly normal  Pelvic: External genitalia:  no lesions              Urethra:  normal appearing urethra with no masses, tenderness or lesions              Bartholins and Skenes: normal                 Vagina: normal appearing vagina with normal color and discharge, no lesions              Cervix: no lesions                Bimanual Exam:  Uterus:  normal size, contour, position, consistency, mobility, non-tender              Adnexa: no mass, fullness, tenderness               Rectal exam: {yes no:[id number]}.  Confirms.              Anus:  normal sphincter tone, no lesions  Chaperone was present for exam.  ASSESSMENT     PLAN     An After Visit Summary was printed and given to the patient.  ______ minutes face to face time of which over 50% was spent in counseling.

## 2020-05-20 ENCOUNTER — Telehealth: Payer: Self-pay | Admitting: Family

## 2020-05-20 LAB — JAK 2 V617F (GENPATH)

## 2020-05-20 NOTE — Telephone Encounter (Signed)
I was able to speak with the patient and go over JAK 2 positive results. Dr. Marin Olp also aware. We will see her on 5/9 and repeat lab work at that time. We will also discuss starting her on Hydrea. Patient in agreement and has no other questions at this time. She was appreciative of call.

## 2020-06-09 ENCOUNTER — Inpatient Hospital Stay (HOSPITAL_BASED_OUTPATIENT_CLINIC_OR_DEPARTMENT_OTHER): Payer: 59 | Admitting: Hematology & Oncology

## 2020-06-09 ENCOUNTER — Telehealth: Payer: Self-pay

## 2020-06-09 ENCOUNTER — Inpatient Hospital Stay: Payer: 59 | Attending: Family

## 2020-06-09 ENCOUNTER — Other Ambulatory Visit: Payer: Self-pay

## 2020-06-09 ENCOUNTER — Other Ambulatory Visit: Payer: Self-pay | Admitting: Obstetrics and Gynecology

## 2020-06-09 ENCOUNTER — Encounter: Payer: Self-pay | Admitting: Hematology & Oncology

## 2020-06-09 VITALS — BP 134/86 | HR 78 | Temp 99.3°F | Resp 18 | Wt 129.0 lb

## 2020-06-09 DIAGNOSIS — Z7189 Other specified counseling: Secondary | ICD-10-CM | POA: Diagnosis not present

## 2020-06-09 DIAGNOSIS — D473 Essential (hemorrhagic) thrombocythemia: Secondary | ICD-10-CM | POA: Diagnosis not present

## 2020-06-09 DIAGNOSIS — D45 Polycythemia vera: Secondary | ICD-10-CM | POA: Diagnosis not present

## 2020-06-09 DIAGNOSIS — R161 Splenomegaly, not elsewhere classified: Secondary | ICD-10-CM | POA: Insufficient documentation

## 2020-06-09 DIAGNOSIS — Z7982 Long term (current) use of aspirin: Secondary | ICD-10-CM | POA: Insufficient documentation

## 2020-06-09 DIAGNOSIS — D751 Secondary polycythemia: Secondary | ICD-10-CM | POA: Insufficient documentation

## 2020-06-09 DIAGNOSIS — Z79899 Other long term (current) drug therapy: Secondary | ICD-10-CM | POA: Diagnosis not present

## 2020-06-09 HISTORY — DX: Essential (hemorrhagic) thrombocythemia: D47.3

## 2020-06-09 HISTORY — DX: Other specified counseling: Z71.89

## 2020-06-09 LAB — CMP (CANCER CENTER ONLY)
ALT: 22 U/L (ref 0–44)
AST: 17 U/L (ref 15–41)
Albumin: 4.4 g/dL (ref 3.5–5.0)
Alkaline Phosphatase: 67 U/L (ref 38–126)
Anion gap: 7 (ref 5–15)
BUN: 17 mg/dL (ref 6–20)
CO2: 26 mmol/L (ref 22–32)
Calcium: 9.8 mg/dL (ref 8.9–10.3)
Chloride: 102 mmol/L (ref 98–111)
Creatinine: 0.68 mg/dL (ref 0.44–1.00)
GFR, Estimated: >60 mL/min (ref >60)
Glucose, Bld: 100 mg/dL — ABNORMAL HIGH (ref 70–99)
Potassium: 4.2 mmol/L (ref 3.5–5.1)
Sodium: 135 mmol/L (ref 135–145)
Total Bilirubin: 0.4 mg/dL (ref 0.3–1.2)
Total Protein: 6.6 g/dL (ref 6.5–8.1)

## 2020-06-09 LAB — CBC WITH DIFFERENTIAL (CANCER CENTER ONLY)
Abs Immature Granulocytes: 0.06 10*3/uL (ref 0.00–0.07)
Basophils Absolute: 0.1 10*3/uL (ref 0.0–0.1)
Basophils Relative: 1 %
Eosinophils Absolute: 0.5 10*3/uL (ref 0.0–0.5)
Eosinophils Relative: 5 %
HCT: 47.4 % — ABNORMAL HIGH (ref 36.0–46.0)
Hemoglobin: 16.4 g/dL — ABNORMAL HIGH (ref 12.0–15.0)
Immature Granulocytes: 1 %
Lymphocytes Relative: 21 %
Lymphs Abs: 2.3 10*3/uL (ref 0.7–4.0)
MCH: 29.5 pg (ref 26.0–34.0)
MCHC: 34.6 g/dL (ref 30.0–36.0)
MCV: 85.3 fL (ref 80.0–100.0)
Monocytes Absolute: 0.7 10*3/uL (ref 0.1–1.0)
Monocytes Relative: 6 %
Neutro Abs: 7.3 10*3/uL (ref 1.7–7.7)
Neutrophils Relative %: 66 %
Platelet Count: 673 10*3/uL — ABNORMAL HIGH (ref 150–400)
RBC: 5.56 MIL/uL — ABNORMAL HIGH (ref 3.87–5.11)
RDW: 12.7 % (ref 11.5–15.5)
WBC Count: 10.9 10*3/uL — ABNORMAL HIGH (ref 4.0–10.5)
nRBC: 0 % (ref 0.0–0.2)

## 2020-06-09 NOTE — Telephone Encounter (Signed)
appts made per 06/09/20 los and pt req to view on my chart   Lori [name]

## 2020-06-09 NOTE — Progress Notes (Signed)
Hematology and Oncology Follow Up Visit  Lulia Schriner [id number] 1995-03-16 25 y.o. 06/09/2020   Principle Diagnosis:   Essential thrombocythemia --JAK2 positive  Current Therapy:    Aspirin 81 mg p.o. daily     Interim History:  Ms. Bove is back for her second office visit.  We first saw her back in April.  She had significant thrombocytosis.  Surprisingly, we found that she was JAK2 positive.  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  I give her a ton of credit for doing this job.  This has to be incredibly challenging to help little babies try to eat and get nutrition.  She has had no problems with the thrombocytosis.  She has had no vasospasm type of symptoms.  There is no erythromelalgia with her hands or feet.  She has had no headache.  There is no rashes.  She has had no cough or shortness of breath.  She is obviously incredibly active.  She exercises.  She has a dachsund.  There is been no problems with her bowels or bladder.  She has no problems with her monthly cycles.  Overall, I would consider her in the low risk category for thrombocythemia.  Currently, her performance status is ECOG 0.  Medications:  Current Outpatient Medications:  .  norethindrone (MICRONOR) 0.35 MG tablet, Take 1 tablet (0.35 mg total) by mouth daily., Disp: 84 tablet, Rfl: 3 .  Norethindrone Acetate-Ethinyl Estrad-FE (LARIN 24 FE) 1-20 MG-MCG(24) tablet, Take 1 tablet by mouth daily., Disp: 84 tablet, Rfl: 3 .  Norethindrone Acetate-Ethinyl Estrad-FE (LOESTRIN 24 FE) 1-20 MG-MCG(24) tablet, TAKE 1 TABLET BY MOUTH ONCE DAILY., Disp: 84 tablet, Rfl: 3  Allergies: No Known Allergies  Past Medical History, Surgical history, Social history, and Family History were reviewed and updated.  Review of Systems: Review of Systems  Constitutional: Negative.   HENT:  Negative.   Eyes: Negative.   Respiratory: Negative.   Cardiovascular: Negative.   Gastrointestinal: Negative.    Endocrine: Negative.   Genitourinary: Negative.    Musculoskeletal: Negative.   Skin: Negative.   Neurological: Negative.   Hematological: Negative.   Psychiatric/Behavioral: Negative.     Physical Exam:  weight is 129 lb (58.5 kg). Her oral temperature is 99.3 F (37.4 C). Her blood pressure is 134/86 and her pulse is 78. Her respiration is 18 and oxygen saturation is 100%.   Wt Readings from Last 3 Encounters:  06/09/20 129 lb (58.5 kg)  05/12/20 129 lb (58.5 kg)  04/18/20 126 lb (57.2 kg)    Physical Exam Vitals reviewed.  HENT:     Head: Normocephalic and atraumatic.  Eyes:     Pupils: Pupils are equal, round, and reactive to light.  Cardiovascular:     Rate and Rhythm: Normal rate and regular rhythm.     Heart sounds: Normal heart sounds.  Pulmonary:     Effort: Pulmonary effort is normal.     Breath sounds: Normal breath sounds.  Abdominal:     General: Bowel sounds are normal.     Palpations: Abdomen is soft.  Musculoskeletal:        General: No tenderness or deformity. Normal range of motion.     Cervical back: Normal range of motion.  Lymphadenopathy:     Cervical: No cervical adenopathy.  Skin:    General: Skin is warm and dry.     Findings: No erythema or rash.  Neurological:     Mental Status: She is alert and  oriented to person, place, and time.  Psychiatric:        Behavior: Behavior normal.        Thought Content: Thought content normal.        Judgment: Judgment normal.      Lab Results  Component Value Date   WBC 10.9 (H) 06/09/2020   HGB 16.4 (H) 06/09/2020   HCT 47.4 (H) 06/09/2020   MCV 85.3 06/09/2020   PLT 673 (H) 06/09/2020     Chemistry      Component Value Date/Time   NA 135 06/09/2020 0804   K 4.2 06/09/2020 0804   CL 102 06/09/2020 0804   CO2 26 06/09/2020 0804   BUN 17 06/09/2020 0804   CREATININE 0.68 06/09/2020 0804   CREATININE 0.71 04/18/2020 1620      Component Value Date/Time   CALCIUM 9.8 06/09/2020 0804    ALKPHOS 67 06/09/2020 0804   AST 17 06/09/2020 0804   ALT 22 06/09/2020 0804   BILITOT 0.4 06/09/2020 0804       Impression and Plan: Ms. Mcqueary is a very charming 25 year old female.  She has essential thrombocythemia.  She is JAK2 positive.  She has low risk disease.  Her platelet count is trending downward right now.  She is totally asymptomatic.  I do not think that baby aspirin would be a bad idea for her right now.  I think that baby aspirin can help with any kind of vasospasms that might occur.  I do not see any problems with her having acquired von Willebrand disease.  For right now, we will just watch her.  I think the only issue that might arise if she does have a child.  I would hope that this will not be for a while.  She comes in with her father.  It was fun talking to both of them.  I answered all of their questions.  She has about the condition turning into leukemia.  I told her this certainly could happen.  I will be watch for worry about her bone marrow condition turning into myelofibrosis.  Regardless, I think any changes in her condition probably would not happen for about 15 years or so.  I would like to see her back in 3 months.  I do not think we have to do a ultrasound of her spleen for 6 months.  She has mild splenomegaly.   [name], MD 5/9/202210:06 AMI

## 2020-08-06 ENCOUNTER — Other Ambulatory Visit (HOSPITAL_COMMUNITY): Payer: Self-pay

## 2020-09-11 ENCOUNTER — Encounter: Payer: Self-pay | Admitting: Hematology & Oncology

## 2020-09-11 ENCOUNTER — Inpatient Hospital Stay (HOSPITAL_BASED_OUTPATIENT_CLINIC_OR_DEPARTMENT_OTHER): Payer: 59 | Admitting: Hematology & Oncology

## 2020-09-11 ENCOUNTER — Inpatient Hospital Stay: Payer: 59 | Attending: Family

## 2020-09-11 ENCOUNTER — Other Ambulatory Visit: Payer: Self-pay

## 2020-09-11 VITALS — BP 124/77 | HR 79 | Temp 98.9°F | Resp 18 | Wt 130.0 lb

## 2020-09-11 DIAGNOSIS — D45 Polycythemia vera: Secondary | ICD-10-CM

## 2020-09-11 DIAGNOSIS — D473 Essential (hemorrhagic) thrombocythemia: Secondary | ICD-10-CM | POA: Insufficient documentation

## 2020-09-11 DIAGNOSIS — R161 Splenomegaly, not elsewhere classified: Secondary | ICD-10-CM | POA: Diagnosis not present

## 2020-09-11 DIAGNOSIS — Z7982 Long term (current) use of aspirin: Secondary | ICD-10-CM | POA: Insufficient documentation

## 2020-09-11 LAB — CMP (CANCER CENTER ONLY)
ALT: 19 U/L (ref 0–44)
AST: 18 U/L (ref 15–41)
Albumin: 4.3 g/dL (ref 3.5–5.0)
Alkaline Phosphatase: 75 U/L (ref 38–126)
Anion gap: 8 (ref 5–15)
BUN: 13 mg/dL (ref 6–20)
CO2: 27 mmol/L (ref 22–32)
Calcium: 9.4 mg/dL (ref 8.9–10.3)
Chloride: 106 mmol/L (ref 98–111)
Creatinine: 0.78 mg/dL (ref 0.44–1.00)
GFR, Estimated: >60 mL/min (ref >60)
Glucose, Bld: 96 mg/dL (ref 70–99)
Potassium: 3.9 mmol/L (ref 3.5–5.1)
Sodium: 141 mmol/L (ref 135–145)
Total Bilirubin: 0.5 mg/dL (ref 0.3–1.2)
Total Protein: 6.3 g/dL — ABNORMAL LOW (ref 6.5–8.1)

## 2020-09-11 LAB — CBC WITH DIFFERENTIAL (CANCER CENTER ONLY)
Abs Immature Granulocytes: 0.04 10*3/uL (ref 0.00–0.07)
Basophils Absolute: 0.1 10*3/uL (ref 0.0–0.1)
Basophils Relative: 1 %
Eosinophils Absolute: 0.3 10*3/uL (ref 0.0–0.5)
Eosinophils Relative: 3 %
HCT: 46.7 % — ABNORMAL HIGH (ref 36.0–46.0)
Hemoglobin: 16.2 g/dL — ABNORMAL HIGH (ref 12.0–15.0)
Immature Granulocytes: 0 %
Lymphocytes Relative: 19 %
Lymphs Abs: 2 10*3/uL (ref 0.7–4.0)
MCH: 29.1 pg (ref 26.0–34.0)
MCHC: 34.7 g/dL (ref 30.0–36.0)
MCV: 84 fL (ref 80.0–100.0)
Monocytes Absolute: 0.7 10*3/uL (ref 0.1–1.0)
Monocytes Relative: 7 %
Neutro Abs: 7.2 10*3/uL (ref 1.7–7.7)
Neutrophils Relative %: 70 %
Platelet Count: 605 10*3/uL — ABNORMAL HIGH (ref 150–400)
RBC: 5.56 MIL/uL — ABNORMAL HIGH (ref 3.87–5.11)
RDW: 13.1 % (ref 11.5–15.5)
WBC Count: 10.3 10*3/uL (ref 4.0–10.5)
nRBC: 0 % (ref 0.0–0.2)

## 2020-09-11 LAB — SAVE SMEAR(SSMR), FOR PROVIDER SLIDE REVIEW

## 2020-09-11 LAB — LACTATE DEHYDROGENASE: LDH: 216 U/L — ABNORMAL HIGH (ref 98–192)

## 2020-09-11 NOTE — Progress Notes (Signed)
Hematology and Oncology Follow Up Visit  Lori [name] [id number] 27-Feb-1995 25 y.o. 09/11/2020   Principle Diagnosis:  Essential thrombocythemia --JAK2 positive  Current Therapy:   Aspirin 81 mg p.o. daily     Interim History:  Lori [name] is back for follow-up.  She comes in with her boyfriend.  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  I am just very impressed with what she can do to try to help little babies and children and moms.  We last saw her back in May.  Prior to this, we did do an ultrasound of her spleen.  She did have some mild splenomegaly with a splenic volume at 560 cubic centimeters.  She is doing well with the aspirin.  She has had no bleeding.  There is been no bruising.  She has had no change in bowel or bladder habits.  She does not have a monthly cycle because of her oral contraceptive.  She had no problems with COVID.  She has had her vaccines.  There is no problems with nausea or vomiting.  She has had no rashes.  There is no headache.  She exercises quite a bit.  She looks like she is in fantastic shape.  We last saw her, her ferritin was 118 with an iron saturation of 21%.  Overall, her performance status is ECOG 0.    Medications:  Current Outpatient Medications:    norethindrone (MICRONOR) 0.35 MG tablet, Take 1 tablet (0.35 mg total) by mouth daily., Disp: 84 tablet, Rfl: 3  Allergies: No Known Allergies  Past Medical History, Surgical history, Social history, and Family History were reviewed and updated.  Review of Systems: Review of Systems  Constitutional: Negative.   HENT:  Negative.    Eyes: Negative.   Respiratory: Negative.    Cardiovascular: Negative.   Gastrointestinal: Negative.   Endocrine: Negative.   Genitourinary: Negative.    Musculoskeletal: Negative.   Skin: Negative.   Neurological: Negative.   Hematological: Negative.   Psychiatric/Behavioral: Negative.     Physical Exam:  weight is 130 lb  (59 kg). Her oral temperature is 98.9 F (37.2 C). Her blood pressure is 124/77 and her pulse is 79. Her respiration is 18 and oxygen saturation is 100%.   Wt Readings from Last 3 Encounters:  09/11/20 130 lb (59 kg)  06/09/20 129 lb (58.5 kg)  05/12/20 129 lb (58.5 kg)    Physical Exam Vitals reviewed.  HENT:     Head: Normocephalic and atraumatic.  Eyes:     Pupils: Pupils are equal, round, and reactive to light.  Cardiovascular:     Rate and Rhythm: Normal rate and regular rhythm.     Heart sounds: Normal heart sounds.  Pulmonary:     Effort: Pulmonary effort is normal.     Breath sounds: Normal breath sounds.  Abdominal:     General: Bowel sounds are normal.     Palpations: Abdomen is soft.  Musculoskeletal:        General: No tenderness or deformity. Normal range of motion.     Cervical back: Normal range of motion.  Lymphadenopathy:     Cervical: No cervical adenopathy.  Skin:    General: Skin is warm and dry.     Findings: No erythema or rash.  Neurological:     Mental Status: She is alert and oriented to person, place, and time.  Psychiatric:        Behavior: Behavior normal.  Thought Content: Thought content normal.        Judgment: Judgment normal.     Lab Results  Component Value Date   WBC 10.3 09/11/2020   HGB 16.2 (H) 09/11/2020   HCT 46.7 (H) 09/11/2020   MCV 84.0 09/11/2020   PLT 605 (H) 09/11/2020     Chemistry      Component Value Date/Time   NA 135 06/09/2020 0804   K 4.2 06/09/2020 0804   CL 102 06/09/2020 0804   CO2 26 06/09/2020 0804   BUN 17 06/09/2020 0804   CREATININE 0.68 06/09/2020 0804   CREATININE 0.71 04/18/2020 1620      Component Value Date/Time   CALCIUM 9.8 06/09/2020 0804   ALKPHOS 67 06/09/2020 0804   AST 17 06/09/2020 0804   ALT 22 06/09/2020 0804   BILITOT 0.4 06/09/2020 0804       Impression and Plan: Lori [name] is a very charming 25 year old female.  She has essential thrombocythemia.  She is JAK2  positive.  She has low risk disease.  Her platelet count is trending downward right now.  It has been going down ever since we started first seeing her.  I am happy about this.  I do not see any reason that we have to institute any therapy on her.  She does have mild splenomegaly.  I cannot feel it on exam.  However, we probably have to follow-up with a ultrasound of the spleen probably next year.  I think we can now get her through the holiday season.  I think we get her back in January.  She is quite busy at work.  I just do not want to make life too difficult for her and have her come out here when she feels okay.      [name], MD 8/11/20223:01 PMI

## 2020-09-12 LAB — VON WILLEBRAND PANEL
Coagulation Factor VIII: 60 % (ref 56–140)
Ristocetin Co-factor, Plasma: 57 % (ref 50–200)
Von Willebrand Antigen, Plasma: 93 % (ref 50–200)

## 2020-09-12 LAB — COAG STUDIES INTERP REPORT

## 2020-10-28 ENCOUNTER — Other Ambulatory Visit (HOSPITAL_COMMUNITY): Payer: Self-pay

## 2021-01-28 ENCOUNTER — Other Ambulatory Visit (HOSPITAL_COMMUNITY): Payer: Self-pay

## 2021-02-05 ENCOUNTER — Inpatient Hospital Stay: Payer: No Typology Code available for payment source | Attending: Hematology & Oncology

## 2021-02-05 ENCOUNTER — Other Ambulatory Visit: Payer: Self-pay

## 2021-02-05 ENCOUNTER — Inpatient Hospital Stay (HOSPITAL_BASED_OUTPATIENT_CLINIC_OR_DEPARTMENT_OTHER): Payer: No Typology Code available for payment source | Admitting: Hematology & Oncology

## 2021-02-05 ENCOUNTER — Encounter: Payer: Self-pay | Admitting: Hematology & Oncology

## 2021-02-05 VITALS — BP 121/78 | HR 82 | Temp 98.4°F | Resp 16 | Wt 128.0 lb

## 2021-02-05 DIAGNOSIS — D473 Essential (hemorrhagic) thrombocythemia: Secondary | ICD-10-CM | POA: Insufficient documentation

## 2021-02-05 DIAGNOSIS — Z79899 Other long term (current) drug therapy: Secondary | ICD-10-CM | POA: Diagnosis not present

## 2021-02-05 DIAGNOSIS — Z7982 Long term (current) use of aspirin: Secondary | ICD-10-CM | POA: Insufficient documentation

## 2021-02-05 DIAGNOSIS — D75839 Thrombocytosis, unspecified: Secondary | ICD-10-CM | POA: Diagnosis not present

## 2021-02-05 LAB — CMP (CANCER CENTER ONLY)
ALT: 23 U/L (ref 0–44)
AST: 13 U/L — ABNORMAL LOW (ref 15–41)
Albumin: 4.7 g/dL (ref 3.5–5.0)
Alkaline Phosphatase: 70 U/L (ref 38–126)
Anion gap: 9 (ref 5–15)
BUN: 18 mg/dL (ref 6–20)
CO2: 29 mmol/L (ref 22–32)
Calcium: 10 mg/dL (ref 8.9–10.3)
Chloride: 101 mmol/L (ref 98–111)
Creatinine: 0.69 mg/dL (ref 0.44–1.00)
GFR, Estimated: >60 mL/min (ref >60)
Glucose, Bld: 91 mg/dL (ref 70–99)
Potassium: 3.6 mmol/L (ref 3.5–5.1)
Sodium: 139 mmol/L (ref 135–145)
Total Bilirubin: 0.6 mg/dL (ref 0.3–1.2)
Total Protein: 6.8 g/dL (ref 6.5–8.1)

## 2021-02-05 LAB — CBC WITH DIFFERENTIAL (CANCER CENTER ONLY)
Abs Immature Granulocytes: 0.08 10*3/uL — ABNORMAL HIGH (ref 0.00–0.07)
Basophils Absolute: 0.1 10*3/uL (ref 0.0–0.1)
Basophils Relative: 1 %
Eosinophils Absolute: 0.3 10*3/uL (ref 0.0–0.5)
Eosinophils Relative: 2 %
HCT: 48.3 % — ABNORMAL HIGH (ref 36.0–46.0)
Hemoglobin: 16.3 g/dL — ABNORMAL HIGH (ref 12.0–15.0)
Immature Granulocytes: 1 %
Lymphocytes Relative: 21 %
Lymphs Abs: 2.4 10*3/uL (ref 0.7–4.0)
MCH: 28.6 pg (ref 26.0–34.0)
MCHC: 33.7 g/dL (ref 30.0–36.0)
MCV: 84.7 fL (ref 80.0–100.0)
Monocytes Absolute: 0.7 10*3/uL (ref 0.1–1.0)
Monocytes Relative: 6 %
Neutro Abs: 8.2 10*3/uL — ABNORMAL HIGH (ref 1.7–7.7)
Neutrophils Relative %: 69 %
Platelet Count: 686 10*3/uL — ABNORMAL HIGH (ref 150–400)
RBC: 5.7 MIL/uL — ABNORMAL HIGH (ref 3.87–5.11)
RDW: 13.1 % (ref 11.5–15.5)
WBC Count: 11.7 10*3/uL — ABNORMAL HIGH (ref 4.0–10.5)
nRBC: 0 % (ref 0.0–0.2)

## 2021-02-05 LAB — SAVE SMEAR(SSMR), FOR PROVIDER SLIDE REVIEW

## 2021-02-05 LAB — LACTATE DEHYDROGENASE: LDH: 229 U/L — ABNORMAL HIGH (ref 98–192)

## 2021-02-05 NOTE — Progress Notes (Signed)
Hematology and Oncology Follow Up Visit  Lori [name] [id number] 10-05-1995 26 y.o. 02/05/2021   Principle Diagnosis:  Essential thrombocythemia --JAK2 positive  Current Therapy:   Aspirin 81 mg p.o. daily     Interim History:  Ms. Lori [name] is back for follow-up.  We last saw her back in August.  The big news is that she is now engaged.  It sounds like she is going to get married in November of this year.  I am very happy for her.  Overall, she is doing well.  She has had no problems with fatigue or weakness.  She is still working over at [employer] she has had no problems with pain.  There is been no rashes.  She has had no headache.  She is taking baby aspirin.  She has had no problems with weight loss or weight gain.  She still exercises.  Overall, I would say performance status is ECOG 0.  .    Medications:  Current Outpatient Medications:    aspirin EC 81 MG tablet, Take 81 mg by mouth daily. Swallow whole., Disp: , Rfl:    norethindrone (MICRONOR) 0.35 MG tablet, Take 1 tablet (0.35 mg total) by mouth daily., Disp: 84 tablet, Rfl: 3  Allergies: No Known Allergies  Past Medical History, Surgical history, Social history, and Family History were reviewed and updated.  Review of Systems: Review of Systems  Constitutional: Negative.   HENT:  Negative.    Eyes: Negative.   Respiratory: Negative.    Cardiovascular: Negative.   Gastrointestinal: Negative.   Endocrine: Negative.   Genitourinary: Negative.    Musculoskeletal: Negative.   Skin: Negative.   Neurological: Negative.   Hematological: Negative.   Psychiatric/Behavioral: Negative.     Physical Exam:  weight is 128 lb (58.1 kg). Her oral temperature is 98.4 F (36.9 C). Her blood pressure is 121/78 and her pulse is 82. Her respiration is 16 and oxygen saturation is 100%.   Wt Readings from Last 3 Encounters:  02/05/21 128 lb (58.1 kg)  09/11/20 130 lb (59 kg)  06/09/20 129 lb (58.5 kg)    Physical  Exam Vitals reviewed.  HENT:     Head: Normocephalic and atraumatic.  Eyes:     Pupils: Pupils are equal, round, and reactive to light.  Cardiovascular:     Rate and Rhythm: Normal rate and regular rhythm.     Heart sounds: Normal heart sounds.  Pulmonary:     Effort: Pulmonary effort is normal.     Breath sounds: Normal breath sounds.  Abdominal:     General: Bowel sounds are normal.     Palpations: Abdomen is soft.  Musculoskeletal:        General: No tenderness or deformity. Normal range of motion.     Cervical back: Normal range of motion.  Lymphadenopathy:     Cervical: No cervical adenopathy.  Skin:    General: Skin is warm and dry.     Findings: No erythema or rash.  Neurological:     Mental Status: She is alert and oriented to person, place, and time.  Psychiatric:        Behavior: Behavior normal.        Thought Content: Thought content normal.        Judgment: Judgment normal.     Lab Results  Component Value Date   WBC 11.7 (H) 02/05/2021   HGB 16.3 (H) 02/05/2021   HCT 48.3 (H) 02/05/2021   MCV 84.7  02/05/2021   PLT 686 (H) 02/05/2021     Chemistry      Component Value Date/Time   NA 139 02/05/2021 1449   K 3.6 02/05/2021 1449   CL 101 02/05/2021 1449   CO2 29 02/05/2021 1449   BUN 18 02/05/2021 1449   CREATININE 0.69 02/05/2021 1449   CREATININE 0.71 04/18/2020 1620      Component Value Date/Time   CALCIUM 10.0 02/05/2021 1449   ALKPHOS 70 02/05/2021 1449   AST 13 (L) 02/05/2021 1449   ALT 23 02/05/2021 1449   BILITOT 0.6 02/05/2021 1449       Impression and Plan: Ms. Lori [name] is a very charming 26 year old female.  She has essential thrombocythemia.  She is JAK2 positive.  She has low risk disease.  Now that she is engaged, I think the next thing we have to worry about is when she is going on try to have a family.  We will have to be very cautious with this.  There is a higher risk of miscarriages and complications with pregnancy in patients  who do have essential thrombocythemia.  I suspect that we probably are going to have to think about getting her on some kind of therapy at the time that she is thinking about getting pregnant.  For right now, I would think that the safest way to go is going to be interferon.  I know that we have long-acting interferon's that might be more patient friendly.  For right now, we will just plan to get her back to see [place] in about 3 months.  We will have to probably do another ultrasound of her abdomen to check her splenomegaly sometime this summer.    [name], MD 1/5/20233:31 PMI

## 2021-02-06 LAB — FERRITIN: Ferritin: 166 ng/mL (ref 11–307)

## 2021-02-06 LAB — IRON AND IRON BINDING CAPACITY (CC-WL,HP ONLY)
Iron: 95 ug/dL (ref 28–170)
Saturation Ratios: 24 % (ref 10.4–31.8)
TIBC: 399 ug/dL (ref 250–450)
UIBC: 304 ug/dL (ref 148–442)

## 2021-04-08 ENCOUNTER — Other Ambulatory Visit: Payer: Self-pay

## 2021-04-08 ENCOUNTER — Encounter: Payer: Self-pay | Admitting: Hematology & Oncology

## 2021-04-08 ENCOUNTER — Inpatient Hospital Stay: Payer: No Typology Code available for payment source | Attending: Hematology & Oncology

## 2021-04-08 ENCOUNTER — Inpatient Hospital Stay (HOSPITAL_BASED_OUTPATIENT_CLINIC_OR_DEPARTMENT_OTHER): Payer: No Typology Code available for payment source | Admitting: Hematology & Oncology

## 2021-04-08 VITALS — BP 117/68 | HR 78 | Temp 97.7°F | Resp 18 | Ht 60.24 in | Wt 128.1 lb

## 2021-04-08 DIAGNOSIS — Z7982 Long term (current) use of aspirin: Secondary | ICD-10-CM | POA: Diagnosis not present

## 2021-04-08 DIAGNOSIS — D473 Essential (hemorrhagic) thrombocythemia: Secondary | ICD-10-CM | POA: Diagnosis present

## 2021-04-08 LAB — CBC WITH DIFFERENTIAL (CANCER CENTER ONLY)
Abs Immature Granulocytes: 0.04 10*3/uL (ref 0.00–0.07)
Basophils Absolute: 0.1 10*3/uL (ref 0.0–0.1)
Basophils Relative: 1 %
Eosinophils Absolute: 0.5 10*3/uL (ref 0.0–0.5)
Eosinophils Relative: 5 %
HCT: 44 % (ref 36.0–46.0)
Hemoglobin: 15.4 g/dL — ABNORMAL HIGH (ref 12.0–15.0)
Immature Granulocytes: 0 %
Lymphocytes Relative: 20 %
Lymphs Abs: 2.1 10*3/uL (ref 0.7–4.0)
MCH: 29.5 pg (ref 26.0–34.0)
MCHC: 35 g/dL (ref 30.0–36.0)
MCV: 84.3 fL (ref 80.0–100.0)
Monocytes Absolute: 0.7 10*3/uL (ref 0.1–1.0)
Monocytes Relative: 7 %
Neutro Abs: 7.2 10*3/uL (ref 1.7–7.7)
Neutrophils Relative %: 67 %
Platelet Count: 634 10*3/uL — ABNORMAL HIGH (ref 150–400)
RBC: 5.22 MIL/uL — ABNORMAL HIGH (ref 3.87–5.11)
RDW: 13.8 % (ref 11.5–15.5)
WBC Count: 10.7 10*3/uL — ABNORMAL HIGH (ref 4.0–10.5)
nRBC: 0 % (ref 0.0–0.2)

## 2021-04-08 LAB — SAVE SMEAR(SSMR), FOR PROVIDER SLIDE REVIEW

## 2021-04-08 LAB — CMP (CANCER CENTER ONLY)
ALT: 16 U/L (ref 0–44)
AST: 15 U/L (ref 15–41)
Albumin: 4.2 g/dL (ref 3.5–5.0)
Alkaline Phosphatase: 75 U/L (ref 38–126)
Anion gap: 8 (ref 5–15)
BUN: 16 mg/dL (ref 6–20)
CO2: 27 mmol/L (ref 22–32)
Calcium: 8.9 mg/dL (ref 8.9–10.3)
Chloride: 102 mmol/L (ref 98–111)
Creatinine: 0.72 mg/dL (ref 0.44–1.00)
GFR, Estimated: >60 mL/min (ref >60)
Glucose, Bld: 105 mg/dL — ABNORMAL HIGH (ref 70–99)
Potassium: 3.7 mmol/L (ref 3.5–5.1)
Sodium: 137 mmol/L (ref 135–145)
Total Bilirubin: 0.6 mg/dL (ref 0.3–1.2)
Total Protein: 6.5 g/dL (ref 6.5–8.1)

## 2021-04-08 LAB — LACTATE DEHYDROGENASE: LDH: 229 U/L — ABNORMAL HIGH (ref 98–192)

## 2021-04-08 NOTE — Progress Notes (Signed)
?Hematology and Oncology Follow Up Visit ? ?Lori [name] ?[id number] ?[birth year] 25 y.o. ?04/08/2021 ? ? ?Principle Diagnosis:  ?Essential thrombocythemia --JAK2 positive ? ?Current Therapy:   ?Aspirin 81 mg p.o. daily ?    ?Interim History:  Lori [name] is back for follow-up. Lori [name] comes in with her mom.  It was fun talking to both of them.  Lori [name] is on the way to the [employer] [employer] [facility] [employer] Tournament tonight. ? ?Lori [name] is still working over at [employer].  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 ? ?Lori [name] is still on track to get married in November.  The wedding date is November 11. ? ?Lori [name] has had no problems with the baby aspirin.  Lori [name] takes this in the morning. ? ?There is been no change in bowel or bladder habits.  Lori [name] has had no bleeding.  Lori [name] has had no issues with her monthly cycles.  There is no pain in her hands or feet.  Lori [name] has had no redness of her hands or feet. ? ?There is been no problem with headaches. ? ?Her appetite has been doing well.  Lori [name] has had no nausea or vomiting. ? ?There is been no issues with COVID. ? ?Lori [name] is still exercising.  Lori [name] would like to try to exercise little bit more. ? ?Overall, her performance status is ECOG 0.   ? ? ?Medications:  ?Current Outpatient Medications:  ?  norethindrone (MICRONOR) 0.35 MG tablet, Take 1 tablet (0.35 mg total) by mouth daily., Disp: 84 tablet, Rfl: 3 ?  aspirin EC 81 MG tablet, Take 81 mg by mouth daily. Swallow whole., Disp: , Rfl:  ? ?Allergies: No Known Allergies ? ?Past Medical History, Surgical history, Social history, and Family History were reviewed and updated. ? ?Review of Systems: ?Review of Systems  ?Constitutional: Negative.   ?HENT:  Negative.    ?Eyes: Negative.   ?Respiratory: Negative.    ?Cardiovascular: Negative.   ?Gastrointestinal: Negative.   ?Endocrine: Negative.   ?Genitourinary: Negative.    ?Musculoskeletal: Negative.   ?Skin: Negative.   ?Neurological:  Negative.   ?Hematological: Negative.   ?Psychiatric/Behavioral: Negative.    ? ?Physical Exam: ? vitals were not taken for this visit.  ? ?Wt Readings from Last 3 Encounters:  ?02/05/21 128 lb (58.1 kg)  ?09/11/20 130 lb (59 kg)  ?06/09/20 129 lb (58.5 kg)  ? ? ?Physical Exam ?Vitals reviewed.  ?HENT:  ?   Head: Normocephalic and atraumatic.  ?Eyes:  ?   Pupils: Pupils are equal, round, and reactive to light.  ?Cardiovascular:  ?   Rate and Rhythm: Normal rate and regular rhythm.  ?   Heart sounds: Normal heart sounds.  ?Pulmonary:  ?   Effort: Pulmonary effort is normal.  ?   Breath sounds: Normal breath sounds.  ?Abdominal:  ?   General: Bowel sounds are normal.  ?   Palpations: Abdomen is soft.  ?Musculoskeletal:     ?   General: No tenderness or deformity. Normal range of motion.  ?   Cervical back: Normal range of motion.  ?Lymphadenopathy:  ?   Cervical: No cervical adenopathy.  ?Skin: ?   General: Skin is warm and dry.  ?   Findings: No erythema or rash.  ?Neurological:  ?   Mental Status: Lori [name] is alert and oriented to person, place, and time.  ?Psychiatric:     ?   Behavior: Behavior normal.     ?  Thought Content: Thought content normal.     ?   Judgment: Judgment normal.  ? ? ? ?Lab Results  ?Component Value Date  ? WBC 10.7 (H) 04/08/2021  ? HGB 15.4 (H) 04/08/2021  ? HCT 44.0 04/08/2021  ? MCV 84.3 04/08/2021  ? PLT 634 (H) 04/08/2021  ? ?  Chemistry   ?   ?Component Value Date/Time  ? NA 139 02/05/2021 1449  ? K 3.6 02/05/2021 1449  ? CL 101 02/05/2021 1449  ? CO2 29 02/05/2021 1449  ? BUN 18 02/05/2021 1449  ? CREATININE 0.69 02/05/2021 1449  ? CREATININE 0.71 04/18/2020 1620  ?    ?Component Value Date/Time  ? CALCIUM 10.0 02/05/2021 1449  ? ALKPHOS 70 02/05/2021 1449  ? AST 13 (L) 02/05/2021 1449  ? ALT 23 02/05/2021 1449  ? BILITOT 0.6 02/05/2021 1449  ?  ? ?  ?Impression and Plan: ?Lori [name] is a very charming 26 year old female.  Lori [name] has essential thrombocythemia.  Lori [name] is JAK2 positive.  Lori [name] has  low risk disease. ? ?I looked at her blood smear.  Everything looks pretty stable on the blood smear.  Lori [name] does have an increased number of platelets.  Lori [name] has large platelets.  They are well granulated.  There is no immature myeloid cells.  I see no nucleated red blood cells. ? ?I would like to get her back in about 3 months.  Hopefully, we can try to work things out so that if everything looks stable in June, we will then get her through the summertime. ? ?I did talk to her about the possibility of what we needed to do if Lori [name] did get pregnant.  Again I think we can have to think about interferon.  I know this is helped with respect to women with pregnancy and essential thrombocythemia.  This has helped get him through pregnancy. ? ?We will certainly have to deal with this in more detail if and when the time comes for her to have a baby. ? ?Again, it was an incredibly delightful talking to Lori [name] and her mom.  Her mom was actually a Leisure centre manager for a Orthopedic group in [facility] ? ? ? ?[name], MD ?3/8/20233:24 PMI ?

## 2021-04-09 LAB — IRON AND IRON BINDING CAPACITY (CC-WL,HP ONLY)
Iron: 63 ug/dL (ref 28–170)
Saturation Ratios: 16 % (ref 10.4–31.8)
TIBC: 386 ug/dL (ref 250–450)
UIBC: 323 ug/dL (ref 148–442)

## 2021-04-09 LAB — FERRITIN: Ferritin: 195 ng/mL (ref 11–307)

## 2021-04-21 NOTE — Progress Notes (Signed)
26 y.o. G0P0000 Single Caucasian female here for annual exam.   ? ?Spots once a month for 2 - 3 days on the Micronor.  ? ?Seeing Dr. Marin Olp for  ?Taking baby ASA. ? 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  ? ?Marrying in November.  ?Considering pregnancy in the next 2 - 3 years.  ? ?PCP:   [facility]  ? ?Patient's last menstrual period was 03/29/2021.     ?Period Cycle (Days): 28 ?Period Duration (Days): 3 ?Period Pattern: Regular ?Menstrual Flow: Light ?Dysmenorrhea: None ?    ?Sexually active: Yes.    ?The current method of family planning is POP ?Exercising: Yes.    Gym/health club. ?Smoker:  no ? ?Health Maintenance: ?Pap:   04-18-20 Neg, 12/14/16 Neg ?History of abnormal Pap:  no ?MMG:  12-19-17 [place] Rt.Br/increased size of mass/BiRads4. Breast bx revealed fibroadenoma ?Colonoscopy:  n/a ?BMD:   n/a  Result  n/a ?TDaP:  01-17-19 ?Gardasil:   yes, completed ?YCX:KGYJE ?Hep C:never ?Screening Labs:  Hb today: PCP, Urine today:  ? ? reports that she has never smoked. She has never been exposed to tobacco smoke. She has never used smokeless tobacco. She reports current alcohol use of about 2.0 standard drinks per week. She reports that she does not use drugs. ? ?Past Medical History:  ?Diagnosis Date  ? COVID 08/2019  ? Essential thrombocythemia ([place]) 06/09/2020  ? Goals of care, counseling/discussion 06/09/2020  ? Thrombocytosis 2022  ? ? ?Past Surgical History:  ?Procedure Laterality Date  ? BREAST SURGERY  12/23/2017  ? right breast biopsy - benign fibroadenoma  ? WISDOM TOOTH EXTRACTION    ? ? ?Current Outpatient Medications  ?Medication Sig Dispense Refill  ? aspirin EC 81 MG tablet Take 81 mg by mouth daily. Swallow whole.    ? norethindrone (MICRONOR) 0.35 MG tablet Take 1 tablet (0.35 mg total) by mouth daily. 84 tablet 3  ? ?No current facility-administered medications for this visit.  ? ? ?Family History  ?Problem Relation Age of Onset  ? Diabetes Father   ? Heart attack Father   ?  Hyperlipidemia Father   ? Hypertension Father   ? Hypertension Maternal Grandmother   ? Hyperlipidemia Maternal Grandmother   ? Diabetes Maternal Grandmother   ? ? ?Review of Systems  ?All other systems reviewed and are negative. ? ?Exam:   ?BP 120/74 (BP Location: Right Arm, Patient Position: Sitting, Cuff Size: Normal)   Pulse 82   Ht 5' 2.5" (1.588 m)   Wt 126 lb (57.2 kg)   LMP 03/29/2021   SpO2 100%   BMI 22.68 kg/m?     ?General appearance: alert, cooperative and appears stated age ?Head: normocephalic, without obvious abnormality, atraumatic ?Neck: no adenopathy, supple, symmetrical, trachea midline and thyroid normal to inspection and palpation ?Lungs: clear to auscultation bilaterally ?Breasts: left - normal appearance, no masses or tenderness, No nipple retraction or dimpling, No nipple discharge or bleeding, No axillary adenopathy ?Right:  normal appearance, 3.5 cm mass at 8 - 9:00. No nipple retraction or dimpling, No nipple discharge or bleeding, No axillary adenopathy ?Heart: regular rate and rhythm ?Abdomen: soft, non-tender; no masses, no organomegaly ?Extremities: extremities normal, atraumatic, no cyanosis or edema ?Skin: skin color, texture, turgor normal. No rashes or lesions ?Lymph nodes: cervical, supraclavicular, and axillary nodes normal. ?Neurologic: grossly normal ? ?Pelvic: External genitalia:  no lesions ?             No abnormal inguinal nodes palpated. ?  Urethra:  normal appearing urethra with no masses, tenderness or lesions ?             Bartholins and Skenes: normal    ?             Vagina: normal appearing vagina with normal color and discharge, no lesions ?             Cervix: no lesions ?             Pap taken: no ?Bimanual Exam:  Uterus:  normal size, contour, position, consistency, mobility, non-tender ?             Adnexa: no mass, fullness, tenderness ?  ? ?Chaperone was present for exam:  Onalee Hua. CMA ? ?Assessment:   ?Well woman visit with gynecologic  exam. ?Right breast fibroadenoma.  Biopsy proven in 2019.  ?Polycythemia. Thrombocythemia.  ?JAK-2 gene mutation.  ?Splenomegaly on abdominal ultrasound. ? ?Plan: ?Mammogram screening age 17 unless fibroadenoma increases in size.  ?Self breast awareness reviewed. ?Pap and HR HPV 2025. ?Screening for gonorrhea, chlamydia and trichomonas. ?Guidelines for Calcium, Vitamin D, regular exercise program including cardiovascular and weight bearing exercise. ?Refill of Micronor for one year.  ?We discussed future pregnancy care through a Maternal Fetal Medicine consultant in concert with Dr. Marin Olp. ?  ?Follow up annually and prn.  ? ?After visit summary provided.  ? ? ? ?

## 2021-04-22 ENCOUNTER — Other Ambulatory Visit (HOSPITAL_COMMUNITY): Payer: Self-pay

## 2021-04-22 ENCOUNTER — Other Ambulatory Visit: Payer: Self-pay

## 2021-04-22 ENCOUNTER — Ambulatory Visit (INDEPENDENT_AMBULATORY_CARE_PROVIDER_SITE_OTHER): Payer: No Typology Code available for payment source | Admitting: Obstetrics and Gynecology

## 2021-04-22 ENCOUNTER — Encounter: Payer: Self-pay | Admitting: Obstetrics and Gynecology

## 2021-04-22 ENCOUNTER — Other Ambulatory Visit (HOSPITAL_COMMUNITY)
Admission: RE | Admit: 2021-04-22 | Discharge: 2021-04-22 | Disposition: A | Discharge status: Home / Self Care | Payer: No Typology Code available for payment source | Source: Ambulatory Visit | Attending: Obstetrics and Gynecology | Admitting: Obstetrics and Gynecology

## 2021-04-22 VITALS — BP 120/74 | HR 82 | Ht 62.5 in | Wt 126.0 lb

## 2021-04-22 DIAGNOSIS — Z113 Encounter for screening for infections with a predominantly sexual mode of transmission: Secondary | ICD-10-CM

## 2021-04-22 DIAGNOSIS — Z01419 Encounter for gynecological examination (general) (routine) without abnormal findings: Secondary | ICD-10-CM | POA: Diagnosis not present

## 2021-04-22 MED ORDER — NORETHINDRONE 0.35 MG PO TABS
1.0000 | ORAL_TABLET | Freq: Every day | ORAL | 3 refills | Status: DC
  Filled 2021-04-22: qty 84, 84d supply, fill #0
  Filled 2021-07-13: qty 84, 84d supply, fill #1
  Filled 2021-10-12: qty 84, 84d supply, fill #2
  Filled 2022-01-05: qty 84, 84d supply, fill #3

## 2021-04-22 NOTE — Patient Instructions (Signed)

## 2021-04-23 LAB — CERVICOVAGINAL ANCILLARY ONLY
Chlamydia: NEGATIVE
Comment: NEGATIVE
Comment: NEGATIVE
Comment: NORMAL
Neisseria Gonorrhea: NEGATIVE
Trichomonas: NEGATIVE

## 2021-04-24 ENCOUNTER — Other Ambulatory Visit (HOSPITAL_COMMUNITY): Payer: Self-pay

## 2021-07-08 ENCOUNTER — Ambulatory Visit: Payer: No Typology Code available for payment source | Admitting: Hematology & Oncology

## 2021-07-08 ENCOUNTER — Inpatient Hospital Stay: Payer: No Typology Code available for payment source

## 2021-07-14 ENCOUNTER — Other Ambulatory Visit (HOSPITAL_COMMUNITY): Payer: Self-pay

## 2021-07-22 ENCOUNTER — Other Ambulatory Visit: Payer: Self-pay | Admitting: Oncology

## 2021-07-22 ENCOUNTER — Inpatient Hospital Stay (HOSPITAL_BASED_OUTPATIENT_CLINIC_OR_DEPARTMENT_OTHER): Payer: No Typology Code available for payment source | Admitting: Hematology & Oncology

## 2021-07-22 ENCOUNTER — Other Ambulatory Visit: Payer: Self-pay

## 2021-07-22 ENCOUNTER — Encounter: Payer: Self-pay | Admitting: Hematology & Oncology

## 2021-07-22 ENCOUNTER — Inpatient Hospital Stay: Payer: No Typology Code available for payment source | Attending: Hematology & Oncology

## 2021-07-22 VITALS — BP 113/74 | HR 82 | Temp 98.0°F | Resp 18 | Ht 62.0 in | Wt 128.8 lb

## 2021-07-22 DIAGNOSIS — D75839 Thrombocytosis, unspecified: Secondary | ICD-10-CM | POA: Diagnosis not present

## 2021-07-22 DIAGNOSIS — D473 Essential (hemorrhagic) thrombocythemia: Secondary | ICD-10-CM | POA: Diagnosis present

## 2021-07-22 DIAGNOSIS — Z7982 Long term (current) use of aspirin: Secondary | ICD-10-CM | POA: Diagnosis not present

## 2021-07-22 LAB — CBC WITH DIFFERENTIAL (CANCER CENTER ONLY)
Abs Immature Granulocytes: 0.03 10*3/uL (ref 0.00–0.07)
Basophils Absolute: 0.1 10*3/uL (ref 0.0–0.1)
Basophils Relative: 1 %
Eosinophils Absolute: 0.4 10*3/uL (ref 0.0–0.5)
Eosinophils Relative: 5 %
HCT: 49 % — ABNORMAL HIGH (ref 36.0–46.0)
Hemoglobin: 16.4 g/dL — ABNORMAL HIGH (ref 12.0–15.0)
Immature Granulocytes: 0 %
Lymphocytes Relative: 24 %
Lymphs Abs: 2 10*3/uL (ref 0.7–4.0)
MCH: 28.7 pg (ref 26.0–34.0)
MCHC: 33.5 g/dL (ref 30.0–36.0)
MCV: 85.7 fL (ref 80.0–100.0)
Monocytes Absolute: 0.4 10*3/uL (ref 0.1–1.0)
Monocytes Relative: 5 %
Neutro Abs: 5.3 10*3/uL (ref 1.7–7.7)
Neutrophils Relative %: 65 %
Platelet Count: 570 10*3/uL — ABNORMAL HIGH (ref 150–400)
RBC: 5.72 MIL/uL — ABNORMAL HIGH (ref 3.87–5.11)
RDW: 12.7 % (ref 11.5–15.5)
WBC Count: 8.2 10*3/uL (ref 4.0–10.5)
nRBC: 0 % (ref 0.0–0.2)

## 2021-07-22 LAB — CMP (CANCER CENTER ONLY)
ALT: 12 U/L (ref 0–44)
AST: 14 U/L — ABNORMAL LOW (ref 15–41)
Albumin: 4.7 g/dL (ref 3.5–5.0)
Alkaline Phosphatase: 78 U/L (ref 38–126)
Anion gap: 8 (ref 5–15)
BUN: 19 mg/dL (ref 6–20)
CO2: 26 mmol/L (ref 22–32)
Calcium: 9.5 mg/dL (ref 8.9–10.3)
Chloride: 105 mmol/L (ref 98–111)
Creatinine: 0.67 mg/dL (ref 0.44–1.00)
GFR, Estimated: >60 mL/min (ref >60)
Glucose, Bld: 120 mg/dL — ABNORMAL HIGH (ref 70–99)
Potassium: 3.6 mmol/L (ref 3.5–5.1)
Sodium: 139 mmol/L (ref 135–145)
Total Bilirubin: 0.9 mg/dL (ref 0.3–1.2)
Total Protein: 7.1 g/dL (ref 6.5–8.1)

## 2021-07-22 LAB — SAVE SMEAR(SSMR), FOR PROVIDER SLIDE REVIEW

## 2021-07-22 LAB — FERRITIN: Ferritin: 53 ng/mL (ref 11–307)

## 2021-07-22 LAB — LACTATE DEHYDROGENASE: LDH: 230 U/L — ABNORMAL HIGH (ref 98–192)

## 2021-07-22 NOTE — Progress Notes (Signed)
Hematology and Oncology Follow Up Visit  Urvi Imes [id number] [birth year] 26 y.o. 07/22/2021   Principle Diagnosis:  Essential thrombocythemia --JAK2 positive  Current Therapy:   Aspirin 81 mg p.o. daily     Interim History:  Ms. Lukes is back for follow-up.  We last saw her back in March.  Since then, she and her fianc now have a new house.  They just moved into the new house.  This is so exciting.  She is still planning on getting married in November.  She is still working over at [employer].  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  She has had no problems with aspirin.  She has had no issues with abdominal pain.  There is no dyspepsia.  There is no change in bowel or bladder habits.  She has had no change in her monthly cycles.  She has had no pain or burning in her hands or feet.  There is no headache.  When we last saw her, her ferritin was 195 with an iron saturation of 16%.  She is exercising.  Overall, I would say performance status is probably ECOG 0.   Medications:  Current Outpatient Medications:    aspirin EC 81 MG tablet, Take 81 mg by mouth daily. Swallow whole., Disp: , Rfl:    norethindrone (MICRONOR) 0.35 MG tablet, Take 1 tablet (0.35 mg total) by mouth daily., Disp: 84 tablet, Rfl: 3  Allergies: No Known Allergies  Past Medical History, Surgical history, Social history, and Family History were reviewed and updated.  Review of Systems: Review of Systems  Constitutional: Negative.   HENT:  Negative.    Eyes: Negative.   Respiratory: Negative.    Cardiovascular: Negative.   Gastrointestinal: Negative.   Endocrine: Negative.   Genitourinary: Negative.    Musculoskeletal: Negative.   Skin: Negative.   Neurological: Negative.   Hematological: Negative.   Psychiatric/Behavioral: Negative.      Physical Exam:  height is '5\' 2"'$  (1.575 m) and weight is 128 lb 12.8 oz (58.4 kg). Her oral  temperature is 98 F (36.7 C). Her blood pressure is 113/74 and her pulse is 82. Her respiration is 18 and oxygen saturation is 100%.   Wt Readings from Last 3 Encounters:  07/22/21 128 lb 12.8 oz (58.4 kg)  04/22/21 126 lb (57.2 kg)  04/08/21 128 lb 1.9 oz (58.1 kg)    Physical Exam Vitals reviewed.  HENT:     Head: Normocephalic and atraumatic.  Eyes:     Pupils: Pupils are equal, round, and reactive to light.  Cardiovascular:     Rate and Rhythm: Normal rate and regular rhythm.     Heart sounds: Normal heart sounds.  Pulmonary:     Effort: Pulmonary effort is normal.     Breath sounds: Normal breath sounds.  Abdominal:     General: Bowel sounds are normal.     Palpations: Abdomen is soft.  Musculoskeletal:        General: No tenderness or deformity. Normal range of motion.     Cervical back: Normal range of motion.  Lymphadenopathy:     Cervical: No cervical adenopathy.  Skin:    General: Skin is warm and dry.     Findings: No erythema or rash.  Neurological:     Mental Status: She is alert and oriented to person, place, and time.  Psychiatric:        Behavior: Behavior normal.  Thought Content: Thought content normal.        Judgment: Judgment normal.      Lab Results  Component Value Date   WBC 8.2 07/22/2021   HGB 16.4 (H) 07/22/2021   HCT 49.0 (H) 07/22/2021   MCV 85.7 07/22/2021   PLT 570 (H) 07/22/2021     Chemistry      Component Value Date/Time   NA 139 07/22/2021 1505   K 3.6 07/22/2021 1505   CL 105 07/22/2021 1505   CO2 26 07/22/2021 1505   BUN 19 07/22/2021 1505   CREATININE 0.67 07/22/2021 1505   CREATININE 0.71 04/18/2020 1620      Component Value Date/Time   CALCIUM 9.5 07/22/2021 1505   ALKPHOS 78 07/22/2021 1505   AST 14 (L) 07/22/2021 1505   ALT 12 07/22/2021 1505   BILITOT 0.9 07/22/2021 1505       Impression and Plan: Ms. Cantu is a very charming 26 year old female.  She has essential thrombocythemia.  She is JAK2  positive.  She has low risk disease.  I looked at her blood smear.  Everything looks pretty stable on the blood smear.  She does have an increased number of platelets.  She has large platelets.  They are well granulated.  There is no immature myeloid cells.  I see no nucleated red blood cells.  Everything still looking quite good.  We do not have to put her on Hydrea.  I realize that her hemoglobin is up a little bit.  I think that as long she takes the aspirin, we should be in good shape.  I told her to make sure she takes aspirin in the morning.  I will plan to see her back in 3 months.  We will try to coronary everything so that we can get her back ultimately right before she gets married in November.   [name], MD 6/21/20234:01 PMI

## 2021-07-23 LAB — IRON AND IRON BINDING CAPACITY (CC-WL,HP ONLY)
Iron: 129 ug/dL (ref 28–170)
Saturation Ratios: 30 % (ref 10.4–31.8)
TIBC: 424 ug/dL (ref 250–450)
UIBC: 295 ug/dL (ref 148–442)

## 2021-10-12 ENCOUNTER — Other Ambulatory Visit (HOSPITAL_COMMUNITY): Payer: Self-pay

## 2021-10-21 ENCOUNTER — Other Ambulatory Visit: Payer: Self-pay

## 2021-10-21 ENCOUNTER — Encounter: Payer: Self-pay | Admitting: Hematology & Oncology

## 2021-10-21 ENCOUNTER — Inpatient Hospital Stay: Payer: No Typology Code available for payment source | Attending: Hematology & Oncology

## 2021-10-21 ENCOUNTER — Inpatient Hospital Stay (HOSPITAL_BASED_OUTPATIENT_CLINIC_OR_DEPARTMENT_OTHER): Payer: No Typology Code available for payment source | Admitting: Hematology & Oncology

## 2021-10-21 VITALS — BP 125/75 | HR 75 | Temp 98.2°F | Resp 18 | Ht 62.0 in | Wt 129.4 lb

## 2021-10-21 DIAGNOSIS — D473 Essential (hemorrhagic) thrombocythemia: Secondary | ICD-10-CM | POA: Diagnosis present

## 2021-10-21 DIAGNOSIS — Z7982 Long term (current) use of aspirin: Secondary | ICD-10-CM | POA: Diagnosis not present

## 2021-10-21 LAB — CBC WITH DIFFERENTIAL (CANCER CENTER ONLY)
Abs Immature Granulocytes: 0.05 10*3/uL (ref 0.00–0.07)
Basophils Absolute: 0.1 10*3/uL (ref 0.0–0.1)
Basophils Relative: 1 %
Eosinophils Absolute: 0.2 10*3/uL (ref 0.0–0.5)
Eosinophils Relative: 2 %
HCT: 48.8 % — ABNORMAL HIGH (ref 36.0–46.0)
Hemoglobin: 16.3 g/dL — ABNORMAL HIGH (ref 12.0–15.0)
Immature Granulocytes: 1 %
Lymphocytes Relative: 17 %
Lymphs Abs: 1.7 10*3/uL (ref 0.7–4.0)
MCH: 28.4 pg (ref 26.0–34.0)
MCHC: 33.4 g/dL (ref 30.0–36.0)
MCV: 85.2 fL (ref 80.0–100.0)
Monocytes Absolute: 0.6 10*3/uL (ref 0.1–1.0)
Monocytes Relative: 6 %
Neutro Abs: 7.6 10*3/uL (ref 1.7–7.7)
Neutrophils Relative %: 73 %
Platelet Count: 609 10*3/uL — ABNORMAL HIGH (ref 150–400)
RBC: 5.73 MIL/uL — ABNORMAL HIGH (ref 3.87–5.11)
RDW: 13.4 % (ref 11.5–15.5)
WBC Count: 10.2 10*3/uL (ref 4.0–10.5)
nRBC: 0 % (ref 0.0–0.2)

## 2021-10-21 LAB — CMP (CANCER CENTER ONLY)
ALT: 14 U/L (ref 0–44)
AST: 15 U/L (ref 15–41)
Albumin: 4.6 g/dL (ref 3.5–5.0)
Alkaline Phosphatase: 78 U/L (ref 38–126)
Anion gap: 9 (ref 5–15)
BUN: 9 mg/dL (ref 6–20)
CO2: 27 mmol/L (ref 22–32)
Calcium: 9.5 mg/dL (ref 8.9–10.3)
Chloride: 104 mmol/L (ref 98–111)
Creatinine: 0.73 mg/dL (ref 0.44–1.00)
GFR, Estimated: >60 mL/min (ref >60)
Glucose, Bld: 91 mg/dL (ref 70–99)
Potassium: 3.9 mmol/L (ref 3.5–5.1)
Sodium: 140 mmol/L (ref 135–145)
Total Bilirubin: 0.5 mg/dL (ref 0.3–1.2)
Total Protein: 7.2 g/dL (ref 6.5–8.1)

## 2021-10-21 LAB — SAVE SMEAR(SSMR), FOR PROVIDER SLIDE REVIEW

## 2021-10-21 NOTE — Progress Notes (Signed)
Hematology and Oncology Follow Up Visit  Lori [name] [id number] Jul 21, 1995 26 y.o. 10/21/2021   Principle Diagnosis:  Essential thrombocythemia --JAK2 positive  Current Therapy:   Aspirin 81 mg p.o. daily     Interim History:  Lori [name] is back for follow-up.  She is doing quite well.  She is doing quite well.  She is still quite busy at work.  She is a Electrical engineer at [employer].  She is getting ready for her wedding.  This will be on November 11.  I am so happy that she is getting married.  She and her fianc bought a new house and have been getting all set up.  They had a very busy summer.  They were down in the [place] for vacation.  They really enjoyed themselves.  She has had no problems with pain.  There is been no headache.  She has had no nausea or vomiting.  She has had no redness or pain in the hands or feet.  She has had no change in bowel or bladder habits.  She still has her monthly cycles.  She is on the baby aspirin.  There is been no dyspepsia.    We last saw her, her iron studies showed a ferritin of 53 with an iron saturation of 30%.  She has had no rashes.  Overall, her performance status is ECOG 0.    Medications:  Current Outpatient Medications:    aspirin EC 81 MG tablet, Take 81 mg by mouth daily. Swallow whole., Disp: , Rfl:    norethindrone (MICRONOR) 0.35 MG tablet, Take 1 tablet (0.35 mg total) by mouth daily., Disp: 84 tablet, Rfl: 3  Allergies: No Known Allergies  Past Medical History, Surgical history, Social history, and Family History were reviewed and updated.  Review of Systems: Review of Systems  Constitutional: Negative.   HENT:  Negative.    Eyes: Negative.   Respiratory: Negative.    Cardiovascular: Negative.   Gastrointestinal: Negative.   Endocrine: Negative.   Genitourinary: Negative.    Musculoskeletal: Negative.   Skin: Negative.   Neurological: Negative.   Hematological: Negative.   Psychiatric/Behavioral:  Negative.      Physical Exam:  height is '5\' 2"'$  (1.575 m) and weight is 129 lb 6.4 oz (58.7 kg). Her oral temperature is 98.2 F (36.8 C). Her blood pressure is 125/75 and her pulse is 75. Her respiration is 18 and oxygen saturation is 100%.   Wt Readings from Last 3 Encounters:  10/21/21 129 lb 6.4 oz (58.7 kg)  07/22/21 128 lb 12.8 oz (58.4 kg)  04/22/21 126 lb (57.2 kg)    Physical Exam Vitals reviewed.  HENT:     Head: Normocephalic and atraumatic.  Eyes:     Pupils: Pupils are equal, round, and reactive to light.  Cardiovascular:     Rate and Rhythm: Normal rate and regular rhythm.     Heart sounds: Normal heart sounds.  Pulmonary:     Effort: Pulmonary effort is normal.     Breath sounds: Normal breath sounds.  Abdominal:     General: Bowel sounds are normal.     Palpations: Abdomen is soft.  Musculoskeletal:        General: No tenderness or deformity. Normal range of motion.     Cervical back: Normal range of motion.  Lymphadenopathy:     Cervical: No cervical adenopathy.  Skin:    General: Skin is warm and dry.     Findings: No erythema  or rash.  Neurological:     Mental Status: She is alert and oriented to person, place, and time.  Psychiatric:        Behavior: Behavior normal.        Thought Content: Thought content normal.        Judgment: Judgment normal.     Lab Results  Component Value Date   WBC 10.2 10/21/2021   HGB 16.3 (H) 10/21/2021   HCT 48.8 (H) 10/21/2021   MCV 85.2 10/21/2021   PLT 609 (H) 10/21/2021     Chemistry      Component Value Date/Time   NA 140 10/21/2021 1513   K 3.9 10/21/2021 1513   CL 104 10/21/2021 1513   CO2 27 10/21/2021 1513   BUN 9 10/21/2021 1513   CREATININE 0.73 10/21/2021 1513   CREATININE 0.71 04/18/2020 1620      Component Value Date/Time   CALCIUM 9.5 10/21/2021 1513   ALKPHOS 78 10/21/2021 1513   AST 15 10/21/2021 1513   ALT 14 10/21/2021 1513   BILITOT 0.5 10/21/2021 1513       Impression and  Plan: Lori [name] is a very charming 26 year old female.  She has essential thrombocythemia.  She is JAK2 positive.  She has low risk disease.  Her platelet count is up a little bit but still I think we are okay without having to make any change in her protocol.  I looked at her blood smear.  Everything looks pretty stable on the blood smear.  She does have an increased number of platelets.  She has large platelets.  They are well granulated.  There is no immature myeloid cells.  I see no nucleated red blood cells.  Everything still looking quite good.  We do not have to put her on Hydrea.  I realize that her hemoglobin and platelets are up a little bit.  I know that she will have a wonderful wedding and wonderful honeymoon.  They will be going to [place].  I would like to see her back in December.  I think we will have to be cautious when and if she becomes pregnant.   [name], MD 9/20/20233:55 PMI

## 2021-10-22 LAB — IRON AND IRON BINDING CAPACITY (CC-WL,HP ONLY)
Iron: 55 ug/dL (ref 28–170)
Saturation Ratios: 14 % (ref 10.4–31.8)
TIBC: 405 ug/dL (ref 250–450)
UIBC: 350 ug/dL (ref 148–442)

## 2021-10-22 LAB — FERRITIN: Ferritin: 72 ng/mL (ref 11–307)

## 2022-01-05 ENCOUNTER — Telehealth: Payer: Self-pay | Admitting: *Deleted

## 2022-01-05 NOTE — Telephone Encounter (Signed)
Pt calling on MD line to r/s appt. Pt advised she would like a call back to discuss rescheduling 12/6 appt. Message to scheduling.

## 2022-01-06 ENCOUNTER — Inpatient Hospital Stay: Payer: No Typology Code available for payment source

## 2022-01-06 ENCOUNTER — Inpatient Hospital Stay: Payer: No Typology Code available for payment source | Admitting: Hematology & Oncology

## 2022-01-06 ENCOUNTER — Other Ambulatory Visit (HOSPITAL_COMMUNITY): Payer: Self-pay

## 2022-01-14 ENCOUNTER — Encounter: Payer: Self-pay | Admitting: Hematology & Oncology

## 2022-01-14 ENCOUNTER — Other Ambulatory Visit: Payer: Self-pay

## 2022-01-14 ENCOUNTER — Inpatient Hospital Stay: Payer: No Typology Code available for payment source | Attending: Hematology & Oncology

## 2022-01-14 ENCOUNTER — Inpatient Hospital Stay (HOSPITAL_BASED_OUTPATIENT_CLINIC_OR_DEPARTMENT_OTHER): Payer: No Typology Code available for payment source | Admitting: Hematology & Oncology

## 2022-01-14 VITALS — BP 130/85 | HR 81 | Temp 98.7°F | Resp 18 | Ht 62.0 in | Wt 133.0 lb

## 2022-01-14 DIAGNOSIS — Z79899 Other long term (current) drug therapy: Secondary | ICD-10-CM | POA: Diagnosis not present

## 2022-01-14 DIAGNOSIS — D75839 Thrombocytosis, unspecified: Secondary | ICD-10-CM | POA: Insufficient documentation

## 2022-01-14 DIAGNOSIS — Z7982 Long term (current) use of aspirin: Secondary | ICD-10-CM | POA: Diagnosis not present

## 2022-01-14 DIAGNOSIS — D473 Essential (hemorrhagic) thrombocythemia: Secondary | ICD-10-CM | POA: Diagnosis present

## 2022-01-14 LAB — CBC WITH DIFFERENTIAL (CANCER CENTER ONLY)
Abs Immature Granulocytes: 0 10*3/uL (ref 0.00–0.07)
Band Neutrophils: 0 %
Basophils Absolute: 0.1 10*3/uL (ref 0.0–0.1)
Basophils Relative: 1 %
Blasts: 0 %
Eosinophils Absolute: 0.4 10*3/uL (ref 0.0–0.5)
Eosinophils Relative: 4 %
HCT: 46.8 % — ABNORMAL HIGH (ref 36.0–46.0)
Hemoglobin: 15.9 g/dL — ABNORMAL HIGH (ref 12.0–15.0)
Lymphocytes Relative: 18 %
Lymphs Abs: 1.8 10*3/uL (ref 0.7–4.0)
MCH: 29.2 pg (ref 26.0–34.0)
MCHC: 34 g/dL (ref 30.0–36.0)
MCV: 86 fL (ref 80.0–100.0)
Metamyelocytes Relative: 0 %
Monocytes Absolute: 0.5 10*3/uL (ref 0.1–1.0)
Monocytes Relative: 5 %
Myelocytes: 0 %
Neutro Abs: 7.1 10*3/uL (ref 1.7–7.7)
Neutrophils Relative %: 72 %
Other: 0 %
Platelet Count: 600 10*3/uL — ABNORMAL HIGH (ref 150–400)
Promyelocytes Relative: 0 %
RBC: 5.44 MIL/uL — ABNORMAL HIGH (ref 3.87–5.11)
RDW: 13.6 % (ref 11.5–15.5)
WBC Count: 9.9 10*3/uL (ref 4.0–10.5)
nRBC: 0 % (ref 0.0–0.2)
nRBC: 0 /100 WBC

## 2022-01-14 LAB — IRON AND IRON BINDING CAPACITY (CC-WL,HP ONLY)
Iron: 76 ug/dL (ref 28–170)
Saturation Ratios: 19 % (ref 10.4–31.8)
TIBC: 402 ug/dL (ref 250–450)
UIBC: 326 ug/dL (ref 148–442)

## 2022-01-14 LAB — CMP (CANCER CENTER ONLY)
ALT: 14 U/L (ref 0–44)
AST: 10 U/L — ABNORMAL LOW (ref 15–41)
Albumin: 4.7 g/dL (ref 3.5–5.0)
Alkaline Phosphatase: 67 U/L (ref 38–126)
Anion gap: 8 (ref 5–15)
BUN: 19 mg/dL (ref 6–20)
CO2: 27 mmol/L (ref 22–32)
Calcium: 9.4 mg/dL (ref 8.9–10.3)
Chloride: 105 mmol/L (ref 98–111)
Creatinine: 0.68 mg/dL (ref 0.44–1.00)
GFR, Estimated: >60 mL/min (ref >60)
Glucose, Bld: 142 mg/dL — ABNORMAL HIGH (ref 70–99)
Potassium: 4.1 mmol/L (ref 3.5–5.1)
Sodium: 140 mmol/L (ref 135–145)
Total Bilirubin: 0.4 mg/dL (ref 0.3–1.2)
Total Protein: 7 g/dL (ref 6.5–8.1)

## 2022-01-14 LAB — FERRITIN: Ferritin: 99 ng/mL (ref 11–307)

## 2022-01-14 LAB — SAVE SMEAR(SSMR), FOR PROVIDER SLIDE REVIEW

## 2022-01-14 LAB — LACTATE DEHYDROGENASE: LDH: 227 U/L — ABNORMAL HIGH (ref 98–192)

## 2022-01-14 NOTE — Progress Notes (Signed)
Hematology and Oncology Follow Up Visit  Lori [name] [id number] 09-05-1995 26 y.o. 01/14/2022   Principle Diagnosis:  Essential thrombocythemia --JAK2 positive  Current Therapy:   Aspirin 81 mg p.o. daily     Interim History:  Lori [name] is back for follow-up.  She is now married.  She had a wonderful wedding in November.  Everything turned out well.  She and her husband were down in [place] for their honeymoon period.  She now is back working.  She is quite busy.  She is taking the baby aspirin.  She has had no problems with the baby aspirin.  She has had no headache.  She has had no pain in the hands or feet.  She has had no rashes.  There is been no bleeding.  Her monthly cycles are normal.  She has had no cough or shortness of breath.  She has had no bony pain.  There is no obvious change in bowel or bladder habits.  There is been no diarrhea.  When we last saw her, her ferritin was 72 with an iron saturation of 14%.  Overall, I would say performance status is ECOG 0.     Medications:  Current Outpatient Medications:    aspirin EC 81 MG tablet, Take 81 mg by mouth daily. Swallow whole., Disp: , Rfl:    norethindrone (MICRONOR) 0.35 MG tablet, Take 1 tablet (0.35 mg total) by mouth daily., Disp: 84 tablet, Rfl: 3  Allergies: No Known Allergies  Past Medical History, Surgical history, Social history, and Family History were reviewed and updated.  Review of Systems: Review of Systems  Constitutional: Negative.   HENT:  Negative.    Eyes: Negative.   Respiratory: Negative.    Cardiovascular: Negative.   Gastrointestinal: Negative.   Endocrine: Negative.   Genitourinary: Negative.    Musculoskeletal: Negative.   Skin: Negative.   Neurological: Negative.   Hematological: Negative.   Psychiatric/Behavioral: Negative.      Physical Exam:  vitals were not taken for this visit.   Wt Readings from Last 3 Encounters:  10/21/21 129 lb 6.4 oz (58.7 kg)  07/22/21 128 lb  12.8 oz (58.4 kg)  04/22/21 126 lb (57.2 kg)    Physical Exam Vitals reviewed.  HENT:     Head: Normocephalic and atraumatic.  Eyes:     Pupils: Pupils are equal, round, and reactive to light.  Cardiovascular:     Rate and Rhythm: Normal rate and regular rhythm.     Heart sounds: Normal heart sounds.  Pulmonary:     Effort: Pulmonary effort is normal.     Breath sounds: Normal breath sounds.  Abdominal:     General: Bowel sounds are normal.     Palpations: Abdomen is soft.  Musculoskeletal:        General: No tenderness or deformity. Normal range of motion.     Cervical back: Normal range of motion.  Lymphadenopathy:     Cervical: No cervical adenopathy.  Skin:    General: Skin is warm and dry.     Findings: No erythema or rash.  Neurological:     Mental Status: She is alert and oriented to person, place, and time.  Psychiatric:        Behavior: Behavior normal.        Thought Content: Thought content normal.        Judgment: Judgment normal.     Lab Results  Component Value Date   WBC 10.2 10/21/2021   HGB  16.3 (H) 10/21/2021   HCT 48.8 (H) 10/21/2021   MCV 85.2 10/21/2021   PLT 609 (H) 10/21/2021     Chemistry      Component Value Date/Time   NA 140 10/21/2021 1513   K 3.9 10/21/2021 1513   CL 104 10/21/2021 1513   CO2 27 10/21/2021 1513   BUN 9 10/21/2021 1513   CREATININE 0.73 10/21/2021 1513   CREATININE 0.71 04/18/2020 1620      Component Value Date/Time   CALCIUM 9.5 10/21/2021 1513   ALKPHOS 78 10/21/2021 1513   AST 15 10/21/2021 1513   ALT 14 10/21/2021 1513   BILITOT 0.5 10/21/2021 1513       Impression and Plan: Ms. Spicer is a very charming 26 year old female.  She has essential thrombocythemia.  She is JAK2 positive.  She has low risk disease.  Her platelet count is up a little bit but still I think we are okay without having to make any change in her protocol.  I just wonder if the platelet count might be up because of the iron level being  down somewhat.  I looked at her blood smear.  Everything looks pretty stable on the blood smear.  She does have an increased number of platelets.  She has large platelets.  They are well granulated.  There is no immature myeloid cells.  I see no nucleated red blood cells.  She says that it probably a couple years before they think about having children.  As such, I do not think we have to make any adjustments with her protocol right now.  We are just watching her.  Will plan to get her back in the Spring.  I know that she is quite busy.  I know that she is enjoying her marriage.  She and her husband are busy with their new house and are doing renovations.   [name], MD 12/14/20238:30 AMI

## 2022-03-15 DIAGNOSIS — Z1589 Genetic susceptibility to other disease: Secondary | ICD-10-CM | POA: Diagnosis not present

## 2022-03-15 DIAGNOSIS — Z0000 Encounter for general adult medical examination without abnormal findings: Secondary | ICD-10-CM | POA: Diagnosis not present

## 2022-03-15 DIAGNOSIS — D473 Essential (hemorrhagic) thrombocythemia: Secondary | ICD-10-CM | POA: Diagnosis not present

## 2022-03-15 DIAGNOSIS — R638 Other symptoms and signs concerning food and fluid intake: Secondary | ICD-10-CM | POA: Diagnosis not present

## 2022-03-23 ENCOUNTER — Other Ambulatory Visit: Payer: Self-pay

## 2022-03-23 ENCOUNTER — Other Ambulatory Visit (HOSPITAL_COMMUNITY): Payer: Self-pay

## 2022-03-23 MED ORDER — CONTRAVE 8-90 MG PO TB12
ORAL_TABLET | ORAL | 0 refills | Status: DC
  Filled 2022-03-23: qty 120, 30d supply, fill #0

## 2022-03-24 ENCOUNTER — Other Ambulatory Visit (HOSPITAL_COMMUNITY): Payer: Self-pay

## 2022-03-29 ENCOUNTER — Other Ambulatory Visit: Payer: Self-pay | Admitting: Obstetrics and Gynecology

## 2022-03-29 DIAGNOSIS — Z3041 Encounter for surveillance of contraceptive pills: Secondary | ICD-10-CM

## 2022-03-30 NOTE — Telephone Encounter (Signed)
Last AEX 04/22/2021--recall sent, nothing scheduled at the moment.   Last fill on 01/06/2022  Rx pend.

## 2022-03-31 MED ORDER — NORETHINDRONE 0.35 MG PO TABS
1.0000 | ORAL_TABLET | Freq: Every day | ORAL | 0 refills | Status: DC
  Filled 2022-03-31: qty 84, 84d supply, fill #0

## 2022-04-01 ENCOUNTER — Other Ambulatory Visit (HOSPITAL_COMMUNITY): Payer: Self-pay

## 2022-04-01 ENCOUNTER — Telehealth: Payer: Self-pay

## 2022-04-01 NOTE — Telephone Encounter (Signed)
Refill of Micronor was approved for 3 months on 03/31/22.

## 2022-04-01 NOTE — Telephone Encounter (Signed)
Patient requesting refill on generic Micronor.  Last AEX 04/22/21 Scheduled 05/26/2022

## 2022-04-02 NOTE — Telephone Encounter (Signed)
Left message to call [facility] Triage, [phone], OPT 4.

## 2022-05-05 ENCOUNTER — Inpatient Hospital Stay (HOSPITAL_BASED_OUTPATIENT_CLINIC_OR_DEPARTMENT_OTHER): Payer: 59 | Admitting: Hematology & Oncology

## 2022-05-05 ENCOUNTER — Inpatient Hospital Stay: Payer: 59 | Attending: Hematology & Oncology

## 2022-05-05 ENCOUNTER — Encounter: Payer: Self-pay | Admitting: Hematology & Oncology

## 2022-05-05 VITALS — BP 122/82 | HR 88 | Temp 98.6°F | Resp 20 | Ht 62.0 in | Wt 133.1 lb

## 2022-05-05 DIAGNOSIS — Z7982 Long term (current) use of aspirin: Secondary | ICD-10-CM | POA: Insufficient documentation

## 2022-05-05 DIAGNOSIS — D473 Essential (hemorrhagic) thrombocythemia: Secondary | ICD-10-CM

## 2022-05-05 LAB — CMP (CANCER CENTER ONLY)
ALT: 27 U/L (ref 0–44)
AST: 16 U/L (ref 15–41)
Albumin: 4.5 g/dL (ref 3.5–5.0)
Alkaline Phosphatase: 65 U/L (ref 38–126)
Anion gap: 8 (ref 5–15)
BUN: 14 mg/dL (ref 6–20)
CO2: 26 mmol/L (ref 22–32)
Calcium: 8.5 mg/dL — ABNORMAL LOW (ref 8.9–10.3)
Chloride: 103 mmol/L (ref 98–111)
Creatinine: 0.71 mg/dL (ref 0.44–1.00)
GFR, Estimated: >60 mL/min (ref >60)
Glucose, Bld: 94 mg/dL (ref 70–99)
Potassium: 4.2 mmol/L (ref 3.5–5.1)
Sodium: 137 mmol/L (ref 135–145)
Total Bilirubin: 0.4 mg/dL (ref 0.3–1.2)
Total Protein: 6.9 g/dL (ref 6.5–8.1)

## 2022-05-05 LAB — CBC WITH DIFFERENTIAL (CANCER CENTER ONLY)
Abs Immature Granulocytes: 0.05 10*3/uL (ref 0.00–0.07)
Basophils Absolute: 0.1 10*3/uL (ref 0.0–0.1)
Basophils Relative: 1 %
Eosinophils Absolute: 0.3 10*3/uL (ref 0.0–0.5)
Eosinophils Relative: 3 %
HCT: 47.3 % — ABNORMAL HIGH (ref 36.0–46.0)
Hemoglobin: 16.1 g/dL — ABNORMAL HIGH (ref 12.0–15.0)
Immature Granulocytes: 1 %
Lymphocytes Relative: 18 %
Lymphs Abs: 1.6 10*3/uL (ref 0.7–4.0)
MCH: 29 pg (ref 26.0–34.0)
MCHC: 34 g/dL (ref 30.0–36.0)
MCV: 85.1 fL (ref 80.0–100.0)
Monocytes Absolute: 0.6 10*3/uL (ref 0.1–1.0)
Monocytes Relative: 7 %
Neutro Abs: 6.3 10*3/uL (ref 1.7–7.7)
Neutrophils Relative %: 70 %
Platelet Count: 317 10*3/uL (ref 150–400)
RBC: 5.56 MIL/uL — ABNORMAL HIGH (ref 3.87–5.11)
RDW: 12.9 % (ref 11.5–15.5)
WBC Count: 8.9 10*3/uL (ref 4.0–10.5)
nRBC: 0 % (ref 0.0–0.2)

## 2022-05-05 LAB — SAVE SMEAR(SSMR), FOR PROVIDER SLIDE REVIEW

## 2022-05-05 LAB — FERRITIN: Ferritin: 82 ng/mL (ref 11–307)

## 2022-05-05 LAB — LACTATE DEHYDROGENASE: LDH: 184 U/L (ref 98–192)

## 2022-05-05 NOTE — Progress Notes (Signed)
Hematology and Oncology Follow Up Visit  Marlys Osbourne [id number] [birth year] 27 y.o. 05/05/2022   Principle Diagnosis:  Essential thrombocythemia --JAK2 positive  Current Therapy:   Aspirin 81 mg p.o. daily     Interim History:  Ms. Norenberg is back for follow-up.  She is doing great.  As always, she is incredibly busy.  She is busy at work.  Thankfully, if she is not as busy at work as she typically is.  She mostly is busy at home renovating a kitchen with her husband.  She now has been married to think about 5 months.  They are doing home renovation.  She does enjoy this.  She has had no problems with respect to nausea or vomiting.  She has had no problems with cough or shortness of breath.  She has had no issues with COVID.  When we last saw her back in December, her ferritin was 99 with an iron saturation of 19%.  She is on baby aspirin.  She is doing well with the baby aspirin.  Is been no rashes.  She has had no leg swelling.  She has had no problems with her monthly cycles.  Currently, I would have said that her performance status is probably ECOG 0.    Medications:  Current Outpatient Medications:    aspirin EC 81 MG tablet, Take 81 mg by mouth daily. Swallow whole., Disp: , Rfl:    norethindrone (MICRONOR) 0.35 MG tablet, Take 1 tablet (0.35 mg total) by mouth daily. Needs office visit for further refills., Disp: 84 tablet, Rfl: 0  Allergies: No Known Allergies  Past Medical History, Surgical history, Social history, and Family History were reviewed and updated.  Review of Systems: Review of Systems  Constitutional: Negative.   HENT:  Negative.    Eyes: Negative.   Respiratory: Negative.    Cardiovascular: Negative.   Gastrointestinal: Negative.   Endocrine: Negative.   Genitourinary: Negative.    Musculoskeletal: Negative.   Skin: Negative.   Neurological: Negative.   Hematological: Negative.   Psychiatric/Behavioral: Negative.      Physical Exam:  height is 5'  2" (1.575 m) and weight is 133 lb 1.3 oz (60.4 kg). Her oral temperature is 98.6 F (37 C). Her blood pressure is 122/82 and her pulse is 88. Her respiration is 20 and oxygen saturation is 99%.   Wt Readings from Last 3 Encounters:  05/05/22 133 lb 1.3 oz (60.4 kg)  01/14/22 133 lb (60.3 kg)  10/21/21 129 lb 6.4 oz (58.7 kg)    Physical Exam Vitals reviewed.  HENT:     Head: Normocephalic and atraumatic.  Eyes:     Pupils: Pupils are equal, round, and reactive to light.  Cardiovascular:     Rate and Rhythm: Normal rate and regular rhythm.     Heart sounds: Normal heart sounds.  Pulmonary:     Effort: Pulmonary effort is normal.     Breath sounds: Normal breath sounds.  Abdominal:     General: Bowel sounds are normal.     Palpations: Abdomen is soft.  Musculoskeletal:        General: No tenderness or deformity. Normal range of motion.     Cervical back: Normal range of motion.  Lymphadenopathy:     Cervical: No cervical adenopathy.  Skin:    General: Skin is warm and dry.     Findings: No erythema or rash.  Neurological:     Mental Status: She is alert and oriented to person,  place, and time.  Psychiatric:        Behavior: Behavior normal.        Thought Content: Thought content normal.        Judgment: Judgment normal.      Lab Results  Component Value Date   WBC 8.9 05/05/2022   HGB 16.1 (H) 05/05/2022   HCT 47.3 (H) 05/05/2022   MCV 85.1 05/05/2022   PLT 317 05/05/2022     Chemistry      Component Value Date/Time   NA 140 01/14/2022 0811   K 4.1 01/14/2022 0811   CL 105 01/14/2022 0811   CO2 27 01/14/2022 0811   BUN 19 01/14/2022 0811   CREATININE 0.68 01/14/2022 0811   CREATININE 0.71 04/18/2020 1620      Component Value Date/Time   CALCIUM 9.4 01/14/2022 0811   ALKPHOS 67 01/14/2022 0811   AST 10 (L) 01/14/2022 0811   ALT 14 01/14/2022 0811   BILITOT 0.4 01/14/2022 0811       Impression and Plan: Ms. Talburt is a very charming 27 year old  female.  She has essential thrombocythemia.  She is JAK2 positive.  She has low risk disease.  I am very surprised that her platelet count is where it is.  This is dropped by about half.  I looked at her blood under the microscope.  I really do not see anything that looked unusual or suspicious.  I am just happy that her platelet count has not gone back up.  Is hard to say when she we will try to start having a family.  When that happens, we will have to monitor her very closely.  I think that for now, we can probably get her back in 4 months.  I think this is reasonable.  She will certainly let us know if she has any problems beforehand.  Marland Kitchen   [name], MD 4/3/20243:42 PMI

## 2022-05-06 LAB — IRON AND IRON BINDING CAPACITY (CC-WL,HP ONLY)
Iron: 102 ug/dL (ref 28–170)
Saturation Ratios: 30 % (ref 10.4–31.8)
TIBC: 343 ug/dL (ref 250–450)
UIBC: 241 ug/dL (ref 148–442)

## 2022-05-13 NOTE — Progress Notes (Signed)
27 y.o. G0P0000 Single Caucasian female here for annual exam.  Doing well on POPs. Has a period for 3 - 5 days with spotting.    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  Had a biopsy in past.   Followed by Dr. Myna Hidalgo for polycythemia and thrombocythemia.  Her last platelet count was normal. She has the JAK-2 gene mutation.   Considering future pregnancy, but not immediately.   Married and renovating a house.  PCP:   Ardean Larsen, MD - [facility]  Patient's last menstrual period was 05/19/2022.     Period Cycle (Days): 28 Period Duration (Days): 4-5 Period Pattern: Regular Menstrual Flow: Light Menstrual Control: Thin pad, Tampon Dysmenorrhea: (!) Mild     Sexually active: Yes.    The current method of family planning is oral progesterone-only contraceptive.    Exercising: Yes.     Light weight lifting, walking 3-5x a week Smoker:  no  Health Maintenance: Pap:  04/18/20 neg, 12/14/16 neg History of abnormal Pap:  no MMG:  12/19/17 U/S R breast, BI-RADS CAT 4 sus, Breast bx revealed fibroadenoma  Colonoscopy:  n/a BMD:   n/a  Result  n/a TDaP:  01/17/19 Gardasil:   yes, completed in middle school HIV: n/a Hep C: n/a Screening Labs:  PCP and oncology.   reports that she has never smoked. She has never been exposed to tobacco smoke. She has never used smokeless tobacco. She reports current alcohol use of about 2.0 standard drinks of alcohol per week. She reports that she does not use drugs.  Past Medical History:  Diagnosis Date   COVID 08/2019   Essential thrombocythemia 06/09/2020   Goals of care, counseling/discussion 06/09/2020   JAK-2 gene mutation    Thrombocytosis 2022    Past Surgical History:  Procedure Laterality Date   BREAST SURGERY  12/23/2017   right breast biopsy - benign fibroadenoma   WISDOM TOOTH EXTRACTION      Current Outpatient Medications  Medication Sig Dispense Refill   aspirin EC 81 MG tablet Take 81 mg by mouth  daily. Swallow whole.     norethindrone (MICRONOR) 0.35 MG tablet Take 1 tablet (0.35 mg total) by mouth daily. Needs office visit for further refills. 84 tablet 3   No current facility-administered medications for this visit.    Family History  Problem Relation Age of Onset   Diabetes Father    Heart attack Father    Hyperlipidemia Father    Hypertension Father    Hypertension Maternal Grandmother    Hyperlipidemia Maternal Grandmother    Diabetes Maternal Grandmother     Review of Systems  All other systems reviewed and are negative.   Exam:   BP 126/84 (BP Location: Right Arm, Patient Position: Sitting, Cuff Size: Normal)   Pulse 94   Ht  (1.575 m)   Wt 128 lb (58.1 kg)   LMP 05/19/2022   SpO2 99%   BMI 23.41 kg/m     General appearance: alert, cooperative and appears stated age Head: normocephalic, without obvious abnormality, atraumatic Neck: no adenopathy, supple, symmetrical, trachea midline and thyroid normal to inspection and palpation Lungs: clear to auscultation bilaterally Breasts: left - normal appearance, no masses or tenderness, No nipple retraction or dimpling, No nipple discharge or bleeding, No axillary adenopathy Right - 4 cm mass of the right lateral breast, nontender, No nipple retraction or dimpling, No nipple discharge or bleeding, No axillary adenopathy Heart: regular rate and rhythm Abdomen: soft, non-tender; no  masses, no organomegaly Extremities: extremities normal, atraumatic, no cyanosis or edema Skin: skin color, texture, turgor normal. No rashes or lesions Lymph nodes: cervical, supraclavicular, and axillary nodes normal. Neurologic: grossly normal  Pelvic: External genitalia:  no lesions              No abnormal inguinal nodes palpated.              Urethra:  normal appearing urethra with no masses, tenderness or lesions              Bartholins and Skenes: normal                 Vagina: normal appearing vagina with normal color and  discharge, no lesions              Cervix: no lesions              Pap taken: no Bimanual Exam:  Uterus:  normal size, contour, position, consistency, mobility, non-tender              Adnexa: no mass, fullness, tenderness             Chaperone was present for exam:  Warren Lacy, CMA  Assessment:   Well woman visit with gynecologic exam. Right breast fibroadenoma.  Biopsy proven in 2019.  Enlarging 4 cm right lateral breast.  Polycythemia. Thrombocythemia.  JAK-2 gene mutation.  Splenomegaly on abdominal ultrasound. Desire for future pregnancy.   Plan: Mammogram screening discussed. Self breast awareness reviewed. Pap and HR HPV 2025. Guidelines for Calcium, Vitamin D, regular exercise program including cardiovascular and weight bearing exercise. Refill of Micronor for one year.  Rubella screen.  Start PNV.  Referral to MFM for pregnancy planning and consultation of care.  Reading for pregnancy preparation discussed.  Bre[facility] and [facility].  Follow up annually and prn.   After visit summary provided.

## 2022-05-26 ENCOUNTER — Encounter: Payer: Self-pay | Admitting: Obstetrics and Gynecology

## 2022-05-26 ENCOUNTER — Other Ambulatory Visit (HOSPITAL_COMMUNITY): Payer: Self-pay

## 2022-05-26 ENCOUNTER — Telehealth: Payer: Self-pay | Admitting: Obstetrics and Gynecology

## 2022-05-26 ENCOUNTER — Ambulatory Visit (INDEPENDENT_AMBULATORY_CARE_PROVIDER_SITE_OTHER): Payer: 59 | Admitting: Obstetrics and Gynecology

## 2022-05-26 VITALS — BP 126/84 | HR 94 | Ht 62.0 in | Wt 128.0 lb

## 2022-05-26 DIAGNOSIS — N6315 Unspecified lump in the right breast, overlapping quadrants: Secondary | ICD-10-CM | POA: Diagnosis not present

## 2022-05-26 DIAGNOSIS — Z319 Encounter for procreative management, unspecified: Secondary | ICD-10-CM

## 2022-05-26 DIAGNOSIS — Z1589 Genetic susceptibility to other disease: Secondary | ICD-10-CM

## 2022-05-26 DIAGNOSIS — Z3041 Encounter for surveillance of contraceptive pills: Secondary | ICD-10-CM

## 2022-05-26 DIAGNOSIS — D75839 Thrombocytosis, unspecified: Secondary | ICD-10-CM

## 2022-05-26 DIAGNOSIS — Z01419 Encounter for gynecological examination (general) (routine) without abnormal findings: Secondary | ICD-10-CM

## 2022-05-26 DIAGNOSIS — D751 Secondary polycythemia: Secondary | ICD-10-CM

## 2022-05-26 MED ORDER — NORETHINDRONE 0.35 MG PO TABS
1.0000 | ORAL_TABLET | Freq: Every day | ORAL | 3 refills | Status: DC
Start: 2022-05-26 — End: 2023-04-12
  Filled 2022-05-26 – 2022-06-16 (×2): qty 84, 84d supply, fill #0
  Filled 2022-09-10: qty 84, 84d supply, fill #1
  Filled 2022-12-02: qty 84, 84d supply, fill #2
  Filled 2023-02-22: qty 84, 84d supply, fill #3

## 2022-05-26 NOTE — Telephone Encounter (Signed)
Please schedule a right breast ultrasound and possible diagnostic mammogram at the [facility] for my patient.   She has an enlarging mass of the right breast previously biapsied as a benign fibroadenoma.

## 2022-05-26 NOTE — Patient Instructions (Signed)

## 2022-05-26 NOTE — Telephone Encounter (Signed)
Please make referral for Maternal Fetal Medicine Consultation [place] [facility] [facility] You may contact the [facility] for [facility] for orientation which university is performing this consultation for the [place] community at this time.   She has a diagnosis of: Polycythemia, Thrombocythemia, and JAK-2 gene mutation.   She is considering future pregnancy.

## 2022-05-27 ENCOUNTER — Other Ambulatory Visit: Payer: Self-pay

## 2022-05-27 LAB — RUBELLA SCREEN: Rubella: 4.99 Index

## 2022-05-27 NOTE — Telephone Encounter (Signed)
Ambulatory referral order placed. I spoke with Isabelle Course at [facility] office and scheduled patient for 06/18/22 at 9:45am to check in.  Office address is:  [address]

## 2022-05-28 NOTE — Telephone Encounter (Signed)
Order placed.  Pt advised and given number to call [facility] to schedule and they will give her directions on how to have previous films from [facility] sent to them for comparison.   She voiced understanding and reported that she will give them a call sometime today.

## 2022-05-28 NOTE — Telephone Encounter (Signed)
Pt is aware of appt date/time/location. Will route to provider for final review and close encounter.

## 2022-06-02 ENCOUNTER — Ambulatory Visit
Admission: RE | Admit: 2022-06-02 | Discharge: 2022-06-02 | Disposition: A | Discharge status: Home / Self Care | Payer: 59 | Source: Ambulatory Visit | Attending: Obstetrics and Gynecology | Admitting: Obstetrics and Gynecology

## 2022-06-02 DIAGNOSIS — N6315 Unspecified lump in the right breast, overlapping quadrants: Secondary | ICD-10-CM

## 2022-06-02 DIAGNOSIS — D241 Benign neoplasm of right breast: Secondary | ICD-10-CM | POA: Diagnosis not present

## 2022-06-02 NOTE — Telephone Encounter (Signed)
Patient had breast u/s performed today at 2:10pm.

## 2022-06-03 NOTE — Telephone Encounter (Signed)
Results received.  Patient will continue in mammogram hold, pending surgery consultation.   I am closing this encounter.

## 2022-06-17 ENCOUNTER — Other Ambulatory Visit (HOSPITAL_COMMUNITY): Payer: Self-pay

## 2022-06-18 ENCOUNTER — Ambulatory Visit: Payer: 59

## 2022-07-06 ENCOUNTER — Ambulatory Visit: Payer: 59

## 2022-07-08 ENCOUNTER — Ambulatory Visit: Payer: Self-pay | Admitting: Surgery

## 2022-07-08 DIAGNOSIS — D241 Benign neoplasm of right breast: Secondary | ICD-10-CM | POA: Diagnosis not present

## 2022-07-08 NOTE — H&P (Signed)
Subjective    Chief Complaint: Breast Mass       History of Present Illness: Lori [name] is a 27 y.o. female who is seen today as an office consultation at the request of Dr. Jacqulyn Bath for evaluation of Breast Mass .   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  The patient has had a known right breast mass for at least 7 years.  This has been followed with ultrasound as well as a previous biopsy.  In October 2017, this mesh measured 2.0 x 0.8 x 1.8 cm.  In 2019, this measured 3.0 x 1.3 x 2.8 cm.  At that time, she underwent biopsy of this area that revealed a fibroadenoma.  The mass is located at 9:00 in the right breast 2 cm from the nipple.  The patient feels that over the last year, the mass has become noticeably larger.  She had another ultrasound recently that measured this mass at 7.8 x 2.6 x 6.0 cm.  She presents now to discuss excision.  The mass is getting to cause some mild distortion in the contour of her breast.  There is minimal discomfort.  No nipple discharge.  No family history of breast cancer.   The patient is on aspirin for thrombocythemia.  Her most recent platelet count on 05/05/2022 was normal at 317.     Review of Systems: A complete review of systems was obtained from the patient.  I have reviewed this information and discussed as appropriate with the patient.  See HPI as well for other ROS.   Review of Systems  Constitutional: Negative.   HENT: Negative.    Eyes: Negative.   Respiratory: Negative.    Cardiovascular: Negative.   Gastrointestinal: Negative.   Genitourinary: Negative.   Musculoskeletal: Negative.   Skin: Negative.   Neurological: Negative.   Endo/Heme/Allergies: Negative.   Psychiatric/Behavioral: Negative.          Medical History: Past Medical History  History reviewed. No pertinent past medical history.     Problem List     Patient Active Problem List   Diagnosis   Essential thrombocythemia (CMS/HHS-HCC)        Past Surgical History       Past Surgical History:  Procedure Laterality Date   wisdom teeth   2018        Allergies  No Known Allergies     Medications Ordered Prior to Encounter        Current Outpatient Medications on File Prior to Visit  Medication Sig Dispense Refill   aspirin 81 MG EC tablet Take 81 mg by mouth once daily        No current facility-administered medications on file prior to visit.        Family History       Family History  Problem Relation Age of Onset   Diabetes Father     Coronary Artery Disease (Blocked arteries around heart) Father          Tobacco Use History  Social History       Tobacco Use  Smoking Status Never  Smokeless Tobacco Never        Social History  Social History        Socioeconomic History   Marital status: Single  Tobacco Use   Smoking status: Never   Smokeless tobacco: Never  Vaping Use   Vaping status: Never Used  Substance  and Sexual Activity   Alcohol use: Yes   Drug use: Never        Objective:         Vitals:    07/08/22 1551  BP: 110/66  Pulse: 94  Temp: 36.9 C (98.4 F)  SpO2: 97%  Weight: 60.2 kg (132 lb 12.8 oz)  Height: 157.5 cm (5\' 2" )  PainSc: 0-No pain    Body mass index is 24.29 kg/m.   Physical Exam    Constitutional:  WDWN in NAD, conversant, no obvious deformities; lying in bed comfortably Eyes:  Pupils equal, round; sclera anicteric; moist conjunctiva; no lid lag HENT:  Oral mucosa moist; good dentition  Neck:  No masses palpated, trachea midline; no thyromegaly Lungs:  CTA bilaterally; normal respiratory effort Breasts:  symmetric, no nipple changes; no palpable masses or lymphadenopathy on the left side.  On the right side lateral to the nipple, there is a large firm smooth palpable mass measuring 8 cm in diameter.  This is beginning to distort the edge of the areola.  No overlying skin changes.  No  axillary lymphadenopathy. CV:  Regular rate and rhythm; no murmurs; extremities well-perfused with no edema Abd:  +bowel sounds, soft, non-tender, no palpable organomegaly; no palpable hernias Musc: Normal gait; no apparent clubbing or cyanosis in extremities Lymphatic:  No palpable cervical or axillary lymphadenopathy Skin:  Warm, dry; no sign of jaundice Psychiatric - alert and oriented x 4; calm mood and affect     Labs, Imaging and Diagnostic Testing: CLINICAL DATA:  27 year old with a biopsy-proven fibroadenoma involving the outer RIGHT breast at 9 o'clock 2 cm from the nipple, presenting with marked enlargement of the fibroadenoma. The ultrasound core needle biopsy procedure was performed on 12/23/2017.   EXAM: ULTRASOUND OF THE RIGHT BREAST   COMPARISON:  12/23/2017, 11/13/2015.   FINDINGS: On correlative physical exam, there is a mobile palpable approximate 6 cm lump in the outer RIGHT breast   Targeted ultrasound is performed, showing that the circumscribed oval parallel heterogeneous mass at 9 o'clock 2 cm from the nipple has significantly increased in size since the prior ultrasounds, currently measuring approximately 7.8 x 2.6 x 6.0 cm (previously 3.0 x 1.3 x 2.8 cm on 12/23/2017 and 2.0 x 0.8 x 1.8 cm on 11/13/2015). The echogenic HydroMark spiral shaped tissue marking clip placed at the time biopsy is visible at the medial edge of the mass.   IMPRESSION: Interval marked increase in size of the biopsy-proven fibroadenoma involving the outer RIGHT breast since 2019, with measurements given above.   RECOMMENDATION: Surgical consultation to discuss excision of the enlarging biopsy-proven fibroadenoma.   I have discussed the findings and recommendations with the patient. A nurse navigator at the [facility] will arrange the appointment for the surgical consultation.   BI-RADS CATEGORY  2: Benign.   Electronically Signed: By: Hulan Saas M.D. On: 06/02/2022 14:34   Assessment and Plan:  Diagnoses and all orders for this visit:   Fibroadenoma of right breast       Excision of right breast fibroadenoma.   The surgical procedure has been discussed with the patient.  Potential risks, benefits, alternative treatments, and expected outcomes have been explained.  All of the patient's questions at this time have been answered.  The likelihood of reaching the patient's treatment goal is good.  The patient understand the proposed surgical procedure and wishes to proceed.         Carnelia Oscar KAI Lyah Millirons,  MD  07/08/2022 5:57 PM

## 2022-07-08 NOTE — H&P (View-Only) (Signed)
Subjective    Chief Complaint: Breast Mass       History of Present Illness: Lori [name] is a 27 y.o. female who is seen today as an office consultation at the request of Dr. Pahwani for evaluation of Breast Mass .   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  The patient has had a known right breast mass for at least 7 years.  This has been followed with ultrasound as well as a previous biopsy.  In October 2017, this mesh measured 2.0 x 0.8 x 1.8 cm.  In 2019, this measured 3.0 x 1.3 x 2.8 cm.  At that time, she underwent biopsy of this area that revealed a fibroadenoma.  The mass is located at 9:00 in the right breast 2 cm from the nipple.  The patient feels that over the last year, the mass has become noticeably larger.  She had another ultrasound recently that measured this mass at 7.8 x 2.6 x 6.0 cm.  She presents now to discuss excision.  The mass is getting to cause some mild distortion in the contour of her breast.  There is minimal discomfort.  No nipple discharge.  No family history of breast cancer.   The patient is on aspirin for thrombocythemia.  Her most recent platelet count on 05/05/2022 was normal at 317.     Review of Systems: A complete review of systems was obtained from the patient.  I have reviewed this information and discussed as appropriate with the patient.  See HPI as well for other ROS.   Review of Systems  Constitutional: Negative.   HENT: Negative.    Eyes: Negative.   Respiratory: Negative.    Cardiovascular: Negative.   Gastrointestinal: Negative.   Genitourinary: Negative.   Musculoskeletal: Negative.   Skin: Negative.   Neurological: Negative.   Endo/Heme/Allergies: Negative.   Psychiatric/Behavioral: Negative.          Medical History: Past Medical History  History reviewed. No pertinent past medical history.     Problem List     Patient Active Problem List   Diagnosis   Essential thrombocythemia (CMS/HHS-HCC)        Past Surgical History       Past Surgical History:  Procedure Laterality Date   wisdom teeth   2018        Allergies  No Known Allergies     Medications Ordered Prior to Encounter        Current Outpatient Medications on File Prior to Visit  Medication Sig Dispense Refill   aspirin 81 MG EC tablet Take 81 mg by mouth once daily        No current facility-administered medications on file prior to visit.        Family History       Family History  Problem Relation Age of Onset   Diabetes Father     Coronary Artery Disease (Blocked arteries around heart) Father          Tobacco Use History  Social History       Tobacco Use  Smoking Status Never  Smokeless Tobacco Never        Social History  Social History        Socioeconomic History   Marital status: Single  Tobacco Use   Smoking status: Never   Smokeless tobacco: Never  Vaping Use   Vaping status: Never Used  Substance   and Sexual Activity   Alcohol use: Yes   Drug use: Never        Objective:         Vitals:    07/08/22 1551  BP: 110/66  Pulse: 94  Temp: 36.9 C (98.4 F)  SpO2: 97%  Weight: 60.2 kg (132 lb 12.8 oz)  Height: 157.5 cm (5' 2")  PainSc: 0-No pain    Body mass index is 24.29 kg/m.   Physical Exam    Constitutional:  WDWN in NAD, conversant, no obvious deformities; lying in bed comfortably Eyes:  Pupils equal, round; sclera anicteric; moist conjunctiva; no lid lag HENT:  Oral mucosa moist; good dentition  Neck:  No masses palpated, trachea midline; no thyromegaly Lungs:  CTA bilaterally; normal respiratory effort Breasts:  symmetric, no nipple changes; no palpable masses or lymphadenopathy on the left side.  On the right side lateral to the nipple, there is a large firm smooth palpable mass measuring 8 cm in diameter.  This is beginning to distort the edge of the areola.  No overlying skin changes.  No  axillary lymphadenopathy. CV:  Regular rate and rhythm; no murmurs; extremities well-perfused with no edema Abd:  +bowel sounds, soft, non-tender, no palpable organomegaly; no palpable hernias Musc: Normal gait; no apparent clubbing or cyanosis in extremities Lymphatic:  No palpable cervical or axillary lymphadenopathy Skin:  Warm, dry; no sign of jaundice Psychiatric - alert and oriented x 4; calm mood and affect     Labs, Imaging and Diagnostic Testing: CLINICAL DATA:  27-year-old with a biopsy-proven fibroadenoma involving the outer RIGHT breast at 9 o'clock 2 cm from the nipple, presenting with marked enlargement of the fibroadenoma. The ultrasound core needle biopsy procedure was performed on 12/23/2017.   EXAM: ULTRASOUND OF THE RIGHT BREAST   COMPARISON:  12/23/2017, 11/13/2015.   FINDINGS: On correlative physical exam, there is a mobile palpable approximate 6 cm lump in the outer RIGHT breast   Targeted ultrasound is performed, showing that the circumscribed oval parallel heterogeneous mass at 9 o'clock 2 cm from the nipple has significantly increased in size since the prior ultrasounds, currently measuring approximately 7.8 x 2.6 x 6.0 cm (previously 3.0 x 1.3 x 2.8 cm on 12/23/2017 and 2.0 x 0.8 x 1.8 cm on 11/13/2015). The echogenic HydroMark spiral shaped tissue marking clip placed at the time biopsy is visible at the medial edge of the mass.   IMPRESSION: Interval marked increase in size of the biopsy-proven fibroadenoma involving the outer RIGHT breast since 2019, with measurements given above.   RECOMMENDATION: Surgical consultation to discuss excision of the enlarging biopsy-proven fibroadenoma.   I have discussed the findings and recommendations with the patient. A nurse navigator at the [facility] will arrange the appointment for the surgical consultation.   BI-RADS CATEGORY  2: Benign.   Electronically Signed: By: Thomas   Lawrence M.D. On: 06/02/2022 14:34   Assessment and Plan:  Diagnoses and all orders for this visit:   Fibroadenoma of right breast       Excision of right breast fibroadenoma.   The surgical procedure has been discussed with the patient.  Potential risks, benefits, alternative treatments, and expected outcomes have been explained.  All of the patient's questions at this time have been answered.  The likelihood of reaching the patient's treatment goal is good.  The patient understand the proposed surgical procedure and wishes to proceed.         Emmanuela Ghazi KAI Maxten Shuler,   MD  07/08/2022 5:57 PM 

## 2022-07-12 IMAGING — US US ABDOMEN COMPLETE
1 series · 14 of 25 positions shown · non-contrast
Comparison: None

CLINICAL DATA: Polycythemia, assess liver and spleen

EXAM:
ABDOMEN ULTRASOUND COMPLETE

[Series 1: us abdomen complete · 14 of 73 slices shown]
[im 1/73]
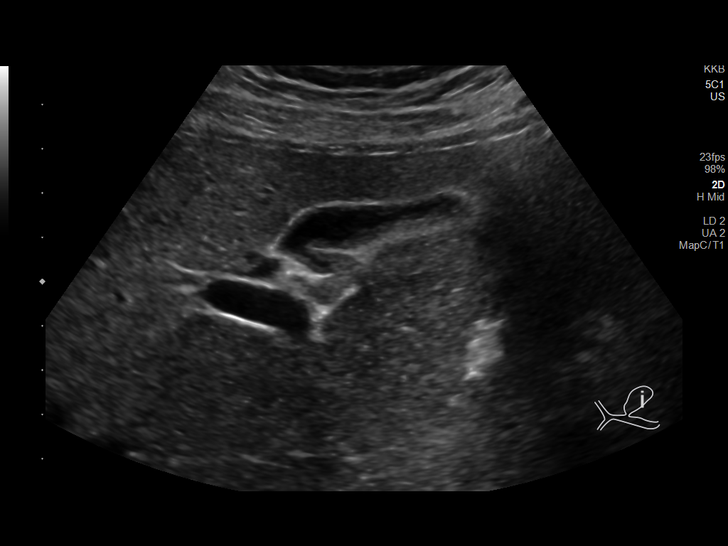
[im 7/73]
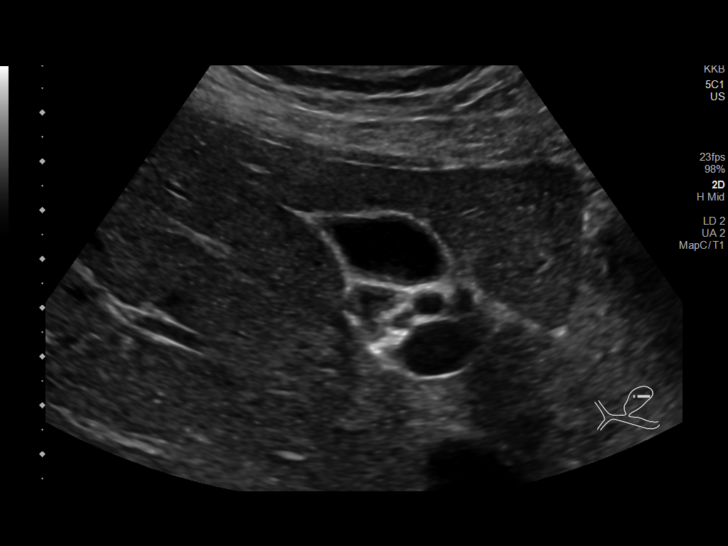
[im 13/73]
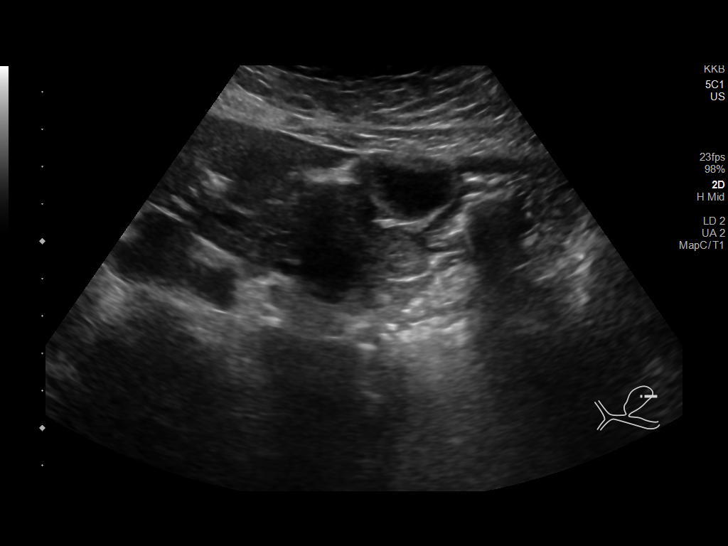
[im 19/73]
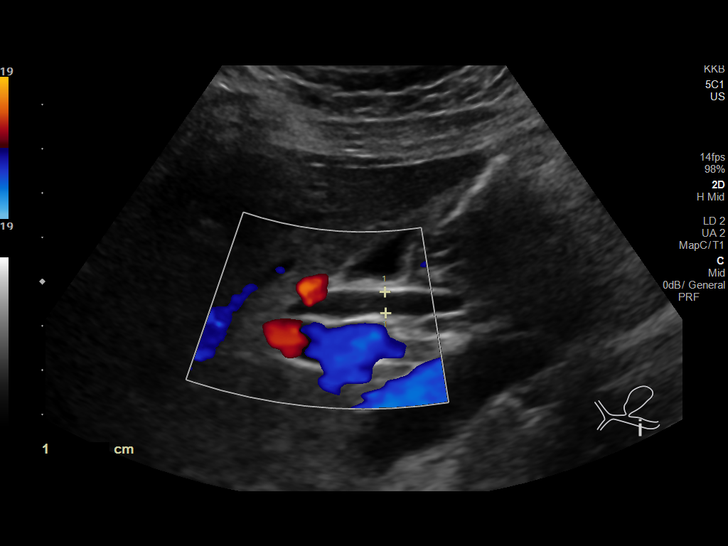
[im 25/73]
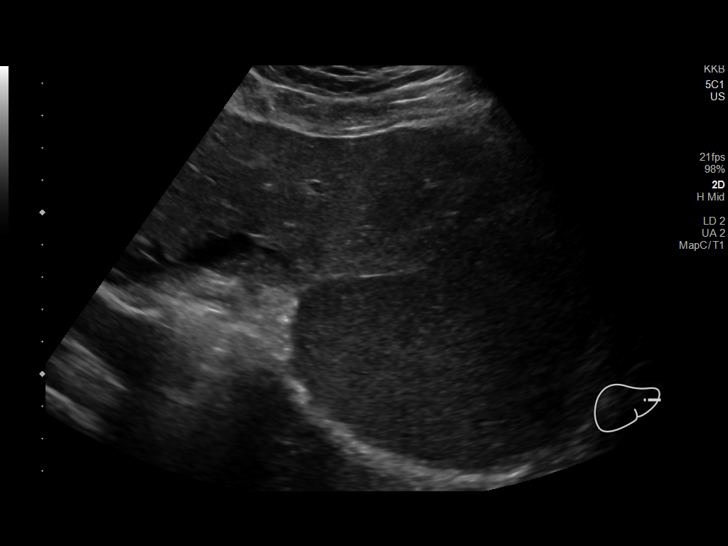
[im 28/73]
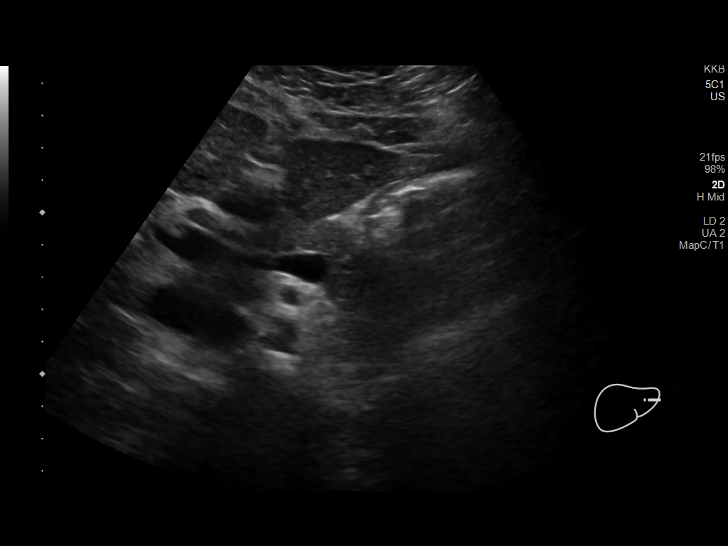
[im 34/73]
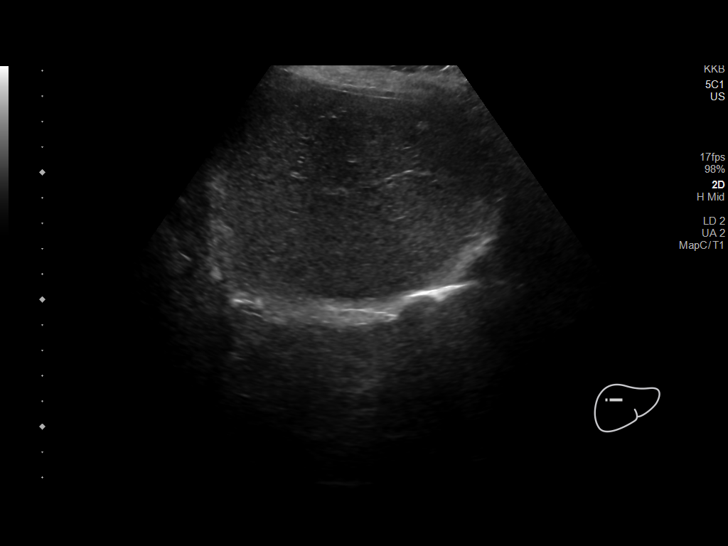
[im 40/73]
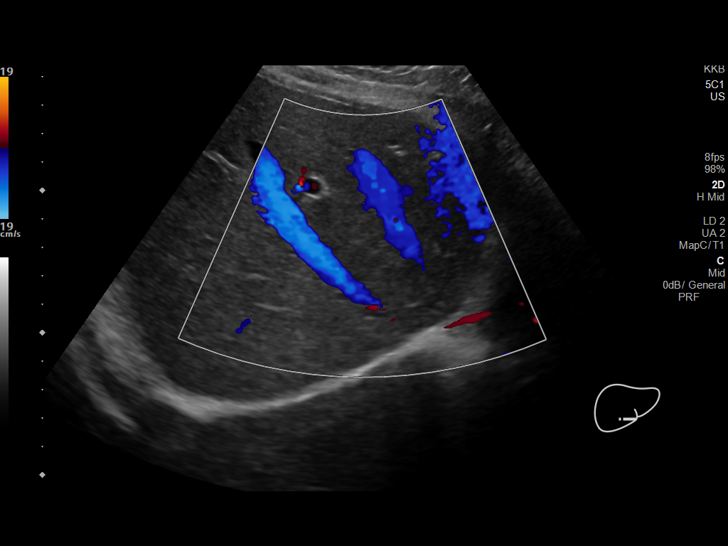
[im 46/73]
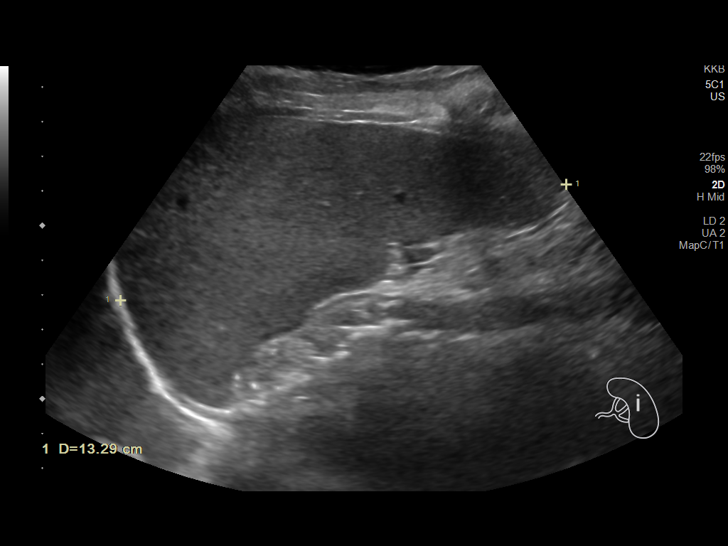
[im 49/73]
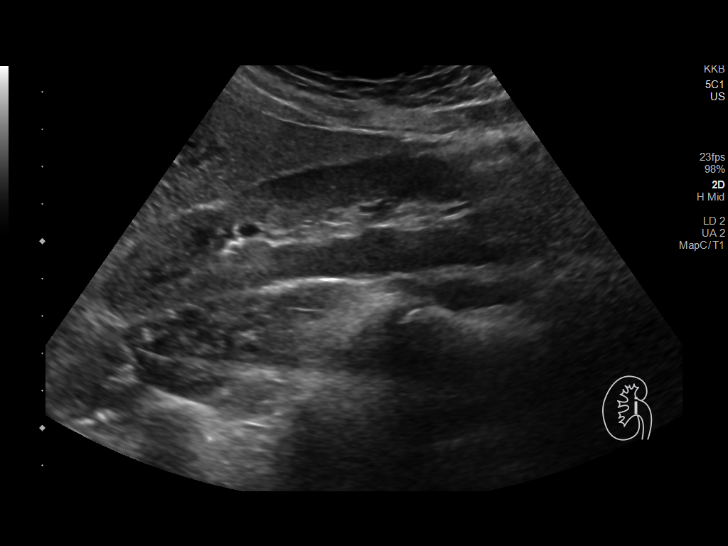
[im 55/73]
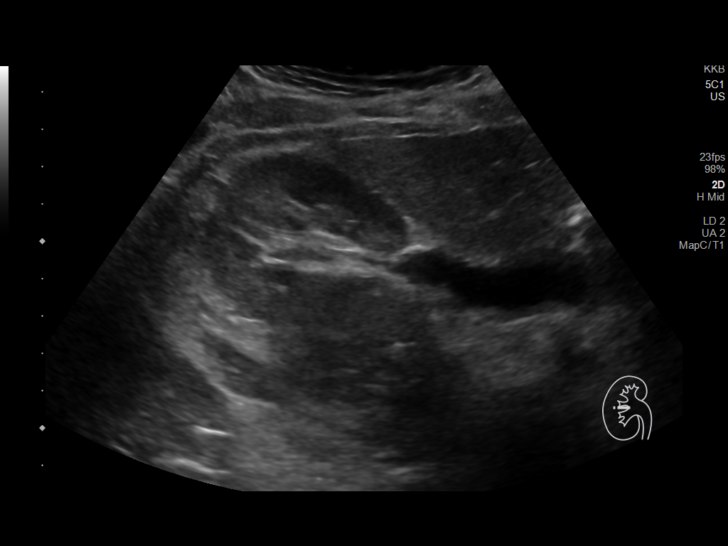
[im 61/73]
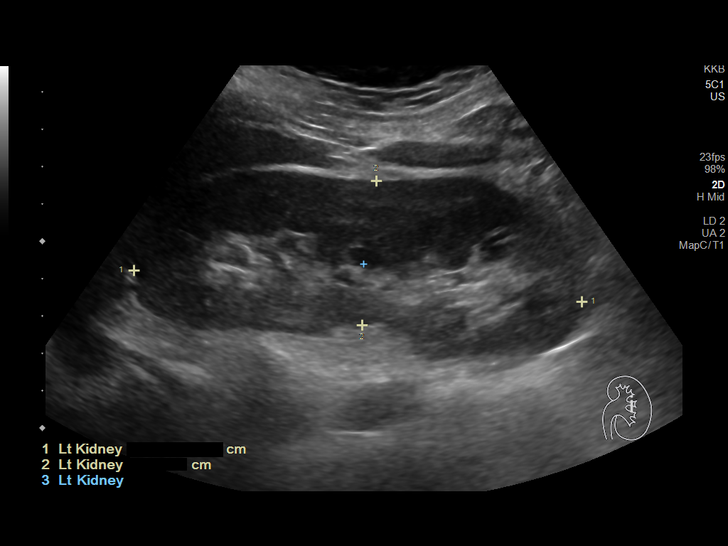
[im 67/73]
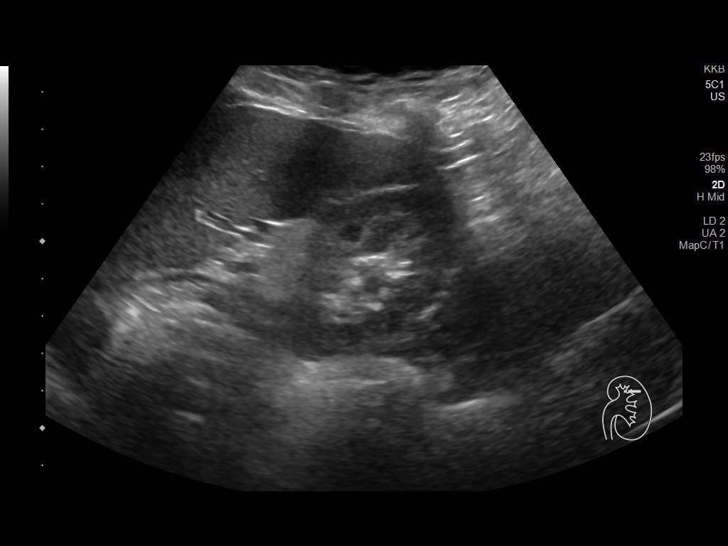
[im 73/73]
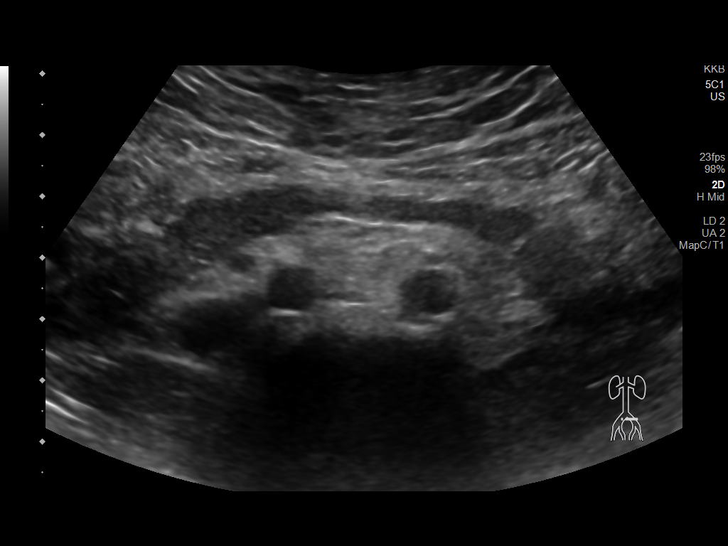

[14 of 25 positions shown; findings below may reference images not displayed]

FINDINGS: Gallbladder: Normally distended without stones or wall thickening.
No pericholecystic fluid or sonographic Murphy sign.

Common bile duct: Diameter: 5 mm, normal

Liver: Normal echogenicity without mass or nodularity. No
intrahepatic biliary dilatation. Portal vein is patent on color
Doppler imaging with normal direction of blood flow towards the
liver.

IVC: Normal appearance

Pancreas: Normal appearance

Spleen: Appears enlarged, 13.7 cm length with a calculated volume of
560 mL. No focal mass.

Right Kidney: Length: 12.8 cm. Normal morphology without mass or
hydronephrosis.

Left Kidney: Length: 12.0 cm. Normal morphology without mass or
hydronephrosis.

Abdominal aorta: Normal caliber

Other findings: No free fluid
IMPRESSION: Splenomegaly.

Otherwise negative exam.

## 2022-07-16 ENCOUNTER — Ambulatory Visit: Payer: 59 | Attending: Obstetrics and Gynecology | Admitting: Obstetrics and Gynecology

## 2022-07-16 ENCOUNTER — Ambulatory Visit: Payer: 59 | Admitting: *Deleted

## 2022-07-16 ENCOUNTER — Encounter: Payer: Self-pay | Admitting: *Deleted

## 2022-07-16 DIAGNOSIS — D473 Essential (hemorrhagic) thrombocythemia: Secondary | ICD-10-CM | POA: Insufficient documentation

## 2022-07-16 DIAGNOSIS — Z3169 Encounter for other general counseling and advice on procreation: Secondary | ICD-10-CM | POA: Insufficient documentation

## 2022-07-16 NOTE — Progress Notes (Signed)
Here for preconception consult    BP 124/73 Pulse 80

## 2022-07-16 NOTE — Progress Notes (Signed)
Maternal-Fetal Medicine   Name: Lori [name] DOB: [birth year] MRN: [id number] Referring Provider: Dr. Forrestine Him. Sharlee Blew, MD  I had the pleasure of seeing [name] at [facility].  She is here for preconception consultation.  She was accompanied by her husband.  She is G0 P0 and has Essential Thrombocythemia. Patient is being followed by her hematologist at [facility].  Her most recent platelet counts have decreased to 317 K from 600.  Hematocrit is increased to 47.3. She has Janus Kinase2 (JAK2) mutation. Patient takes low-dose aspirin (81 mg). She does not have symptoms of weakness or headache or dizziness or shortness of breath or night sweats or inability to concentrate or epistaxis.  She reports she bruises easily.  No history of gastric bleeding. No personal history of venous thromboembolism. Patient has right breast mass measuring 7.8 x 2.6x 6 cm that has increased from her previous ultrasound (2019).  She had consultation with her breast surgeon Dr. Manus Rudd.  Surgical removal is planned for 07/29/2022 under general anesthesia.   Past medical history: No history of hypertension or diabetes or thyroid disorders. Past surgical history wisdom tooth removal, breast biopsy. Medications: Aspirin 81 mg daily, norethindrone 0.35 mg daily (contraception). Allergies: No known drug allergies. Social history: Denies tobacco or drug or alcohol use.  She has been married 7 months and her husband is in good health.  He is Hispanic. Family history: Father had MI at 37 years of age, and he has diabetes and hypertension.  Mother is in good health.  She has 2 brothers, and they are in good health.  No family history of venous thromboembolism. GYN history: Patient reports regular periods. LMP 07/12/2022.  No history of cervical surgeries.  Our concerns include: Essential Thrombocythemia (ET) It is one of the JAK2 mutation-prevalent myeloproliferative neoplasms  (MPN).  Its prevalence is reported as 3 per 100,000 pregnancies per year.  About 20% of ET or diagnosed under 45 years of age.  Treatment includes low-dose aspirin and prophylactic heparin in some cases.  Phlebotomy and cytoreduction are required in some cases.  Interferon-? is one of the preferred treatments, and it is not contraindicated in pregnancy.  In a retrospective study of 120 pregnancies in 84 women with essential thrombocythemia, overall live birth rate was 69%.  Spontaneous abortions were seen at 26% of the pregnancies.  Stillbirth rate is increased up to 5% (as opposed to 0.5% in patients without ET).  Maternal thrombosis occurred in 2.5% and hemorrhage in 5.8%.  Treatment with aspirin showed significant reduction in pregnancy complications.  The absence of previous pregnancy loss, the absolute risk of pregnancy complications was small.  That study, platelet counts decreased 43% during pregnancy with nadir at delivery.  Platelet counts recover in the postpartum period.  Presence of JAK2 mutation did not increase the risk of complications.  Preeclampsia and preterm delivery rates are unchanged.  Venous thromboembolism risk is categorized into very low, low, and high risk groups.  Age below 60 years, the presence of JAK2 mutation and no history of VTE confer low risk for thrombosis.  Our patient will be considered very low risk if she had tested negative for JAK2 mutation or tested positive for JAK2 wild-type mutation.  I counseled the patient on the risk of miscarriage, stillbirth and VTE risk. She has a low risk for thrombosis.  I did not recommend heparin anticoagulation in pregnancy.  However, she may receive heparin anticoagulation after delivery for 6 weeks.  I reassured the patient that aspirin can be safely taken in pregnancy even in the first trimester.  Benefits far outweigh the risks.  I reassured the couple there are no high-risk factors and that she can attempt pregnancy after  breast surgery that should rule out malignancy.  Prenatal: I counseled the patient on the benefit of preconception folic acid that should be taken at least 1 month before planned conception (preferably 3 months) to reduce the likelihood of open spina bifida.  I discussed prenatal care including aneuploidy screening at the first trimester.  I recommend fetal anatomy scan at 18 to 20 weeks followed by fetal growth assessments every 4 weeks.  Because of increased risk of stillbirth, I recommend weekly antenatal testing from [redacted] weeks gestation till delivery.  Recommendations -Preconception folic acid 400 micrograms daily or one prenatal vitamin daily. -Pregnancy may be attempted after breast surgery (if benign). -Fetal anatomy scan at 18-20 weeks. -Fetal growth assessments every 4 weeks. -Weekly antenatal testing from [redacted] weeks gestation till delivery. -Low-dose aspirin to continue in pregnancy including first trimester. -Heparin anticoagulation for 6 weeks after delivery.  Thank you for consultation.  If you have any questions or concerns, please contact me the [facility].  Consultation including face-to-face (more than 50%) counseling 45 minutes.

## 2022-07-19 NOTE — Pre-Procedure Instructions (Signed)
Surgical Instructions    Your procedure is scheduled on Thursday, June 27th.  Report to [facility] Main Entrance "A" at 06:00 A.M., then check in with the Admitting office.  Call this number if you have problems the morning of surgery:  [phone]  If you have any questions prior to your surgery date call [phone]: Open Monday-Friday 8am-4pm If you experience any cold or flu symptoms such as cough, fever, chills, shortness of breath, etc. between now and your scheduled surgery, please notify us at the above number.     Remember:  Do not eat after midnight the night before your surgery  You may drink clear liquids until 05:00 AM the morning of your surgery.   Clear liquids allowed are: Water, Non-Citrus Juices (without pulp), Carbonated Beverages, Clear Tea, Black Coffee Only (NO MILK, CREAM OR POWDERED CREAMER of any kind), and Gatorade.   Patient Instructions  The night before surgery:  No food after midnight. ONLY clear liquids after midnight  The day of surgery (if you do NOT have diabetes):  Drink ONE (1) Pre-Surgery Clear Ensure by 05:00 AM the morning of surgery. Drink in one sitting. Do not sip.  This drink was given to you during your hospital  pre-op appointment visit.  Nothing else to drink after completing the  Pre-Surgery Clear Ensure.          If you have questions, please contact your surgeon's office.     Take these medicines the morning of surgery with A SIP OF WATER  norethindrone (MICRONOR)   Follow your surgeon's instructions on when to stop Aspirin.  If no instructions were given by your surgeon then you will need to call the office to get those instructions.     As of today, STOP taking any Aleve, Naproxen, Ibuprofen, Motrin, Advil, Goody's, BC's, all herbal medications, fish oil, and all vitamins.                     Do NOT Smoke (Tobacco/Vaping) for 24 hours prior to your procedure.  If you use a CPAP at night, you may bring your mask/headgear  for your overnight stay.   Contacts, glasses, piercing's, hearing aid's, dentures or partials may not be worn into surgery, please bring cases for these belongings.    For patients admitted to the hospital, discharge time will be determined by your treatment team.   Patients discharged the day of surgery will not be allowed to drive home, and someone needs to stay with them for 24 hours.  SURGICAL WAITING ROOM VISITATION Patients having surgery or a procedure may have no more than 2 support people in the waiting area - these visitors may rotate.   Children under the age of 28 must have an adult with them who is not the patient. If the patient needs to stay at the hospital during part of their recovery, the visitor guidelines for inpatient rooms apply. Pre-op nurse will coordinate an appropriate time for 1 support person to accompany patient in pre-op.  This support person may not rotate.   Please refer to the [facility] website for the visitor guidelines for Inpatients (after your surgery is over and you are in a regular room).    Special instructions:   [place]- Preparing For Surgery  Before surgery, you can play an important role. Because skin is not sterile, your skin needs to be as free of germs as possible. You can reduce the number of germs on your skin by  washing with CHG (chlorahexidine gluconate) Soap before surgery.  CHG is an antiseptic cleaner which kills germs and bonds with the skin to continue killing germs even after washing.    Oral Hygiene is also important to reduce your risk of infection.  Remember - BRUSH YOUR TEETH THE MORNING OF SURGERY WITH YOUR REGULAR TOOTHPASTE  Please do not use if you have an allergy to CHG or antibacterial soaps. If your skin becomes reddened/irritated stop using the CHG.  Do not shave (including legs and underarms) for at least 48 hours prior to first CHG shower. It is OK to shave your face.  Please follow these instructions  carefully.   Shower the NIGHT BEFORE SURGERY and the MORNING OF SURGERY  If you chose to wash your hair, wash your hair first as usual with your normal shampoo.  After you shampoo, rinse your hair and body thoroughly to remove the shampoo.  Use CHG Soap as you would any other liquid soap. You can apply CHG directly to the skin and wash gently with a scrungie or a clean washcloth.   Apply the CHG Soap to your body ONLY FROM THE NECK DOWN.  Do not use on open wounds or open sores. Avoid contact with your eyes, ears, mouth and genitals (private parts). Wash Face and genitals (private parts)  with your normal soap.   Wash thoroughly, paying special attention to the area where your surgery will be performed.  Thoroughly rinse your body with warm water from the neck down.  DO NOT shower/wash with your normal soap after using and rinsing off the CHG Soap.  Pat yourself dry with a CLEAN TOWEL.  Wear CLEAN PAJAMAS to bed the night before surgery  Place CLEAN SHEETS on your bed the night before your surgery  DO NOT SLEEP WITH PETS.   Day of Surgery: Take a shower with CHG soap. Do not wear jewelry or makeup Do not wear lotions, powders, perfumes/colognes, or deodorant. Do not shave 48 hours prior to surgery.  Men may shave face and neck. Do not bring valuables to the hospital.  [facility] is not responsible for any belongings or valuables. Do not wear nail polish, gel polish, artificial nails, or any other type of covering on natural nails (fingers and toes) If you have artificial nails or gel coating that need to be removed by a nail salon, please have this removed prior to surgery. Artificial nails or gel coating may interfere with anesthesia's ability to adequately monitor your vital signs. Wear Clean/Comfortable clothing the morning of surgery Remember to brush your teeth WITH YOUR REGULAR TOOTHPASTE.   Please read over the following fact sheets that you were given.    If you  received a COVID test during your pre-op visit  it is requested that you wear a mask when out in public, stay away from anyone that may not be feeling well and notify your surgeon if you develop symptoms. If you have been in contact with anyone that has tested positive in the last 10 days please notify you surgeon.

## 2022-07-20 ENCOUNTER — Encounter (HOSPITAL_COMMUNITY)
Admission: RE | Admit: 2022-07-20 | Discharge: 2022-07-20 | Disposition: A | Discharge status: Home / Self Care | Payer: 59 | Source: Ambulatory Visit | Attending: Surgery | Admitting: Surgery

## 2022-07-20 ENCOUNTER — Encounter (HOSPITAL_COMMUNITY): Payer: Self-pay

## 2022-07-20 ENCOUNTER — Other Ambulatory Visit: Payer: Self-pay

## 2022-07-20 VITALS — BP 120/78 | HR 78 | Temp 98.6°F | Resp 18 | Ht 62.0 in | Wt 134.6 lb

## 2022-07-20 DIAGNOSIS — D241 Benign neoplasm of right breast: Secondary | ICD-10-CM | POA: Insufficient documentation

## 2022-07-20 DIAGNOSIS — Z01818 Encounter for other preprocedural examination: Secondary | ICD-10-CM | POA: Insufficient documentation

## 2022-07-20 DIAGNOSIS — Z7982 Long term (current) use of aspirin: Secondary | ICD-10-CM | POA: Insufficient documentation

## 2022-07-20 DIAGNOSIS — D473 Essential (hemorrhagic) thrombocythemia: Secondary | ICD-10-CM | POA: Insufficient documentation

## 2022-07-20 LAB — CBC
HCT: 48.6 % — ABNORMAL HIGH (ref 36.0–46.0)
Hemoglobin: 16.2 g/dL — ABNORMAL HIGH (ref 12.0–15.0)
MCH: 28.9 pg (ref 26.0–34.0)
MCHC: 33.3 g/dL (ref 30.0–36.0)
MCV: 86.6 fL (ref 80.0–100.0)
Platelets: 729 10*3/uL — ABNORMAL HIGH (ref 150–400)
RBC: 5.61 MIL/uL — ABNORMAL HIGH (ref 3.87–5.11)
RDW: 14 % (ref 11.5–15.5)
WBC: 9.7 10*3/uL (ref 4.0–10.5)
nRBC: 0 % (ref 0.0–0.2)

## 2022-07-20 NOTE — Progress Notes (Signed)
PCP - Ardean Larsen, MD  Cardiologist - denies  PPM/ICD - n/a  Chest x-ray - n/a EKG - n/a Stress Test - denies ECHO - denies Cardiac Cath - denies  Sleep Study - denies CPAP - denies  Blood Thinner Instructions: n/a Aspirin Instructions: Pt instructed to call surgeon's office regarding instructions.   ERAS Protcol -Clear liquids until 0500 DOS PRE-SURGERY Ensure or G2- Ensure provided.  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   Patient denies shortness of breath, fever, cough and chest pain at PAT appointment   All instructions explained to the patient, with a verbal understanding of the material. Patient agrees to go over the instructions while at home for a better understanding. Patient also instructed to self quarantine after being tested for COVID-19. The opportunity to ask questions was provided.

## 2022-07-21 NOTE — Anesthesia Preprocedure Evaluation (Addendum)
Anesthesia Evaluation  Patient identified by MRN, date of birth, ID band Patient awake    Reviewed: Allergy & Precautions, NPO status , Patient's Chart, lab work & pertinent test results  History of Anesthesia Complications Negative for: history of anesthetic complications  Airway Mallampati: I  TM Distance: >3 FB Neck ROM: Full    Dental  (+) Dental Advisory Given, Teeth Intact   Pulmonary neg pulmonary ROS   breath sounds clear to auscultation       Cardiovascular negative cardio ROS  Rhythm:Regular Rate:Normal     Neuro/Psych negative neurological ROS     GI/Hepatic negative GI ROS, Neg liver ROS,,,  Endo/Other  negative endocrine ROS    Renal/GU negative Renal ROS     Musculoskeletal   Abdominal   Peds  Hematology AK2 thrombocythemia is followed by Dr. Myna Hidalgo. PLT count had been in the 600K range (up to 749K 04/18/20 and referred to hematology then), but down to 317K at her 05/05/22.   Anesthesia Other Findings Fibroadenoma breast  Reproductive/Obstetrics OCPs                             Anesthesia Physical Anesthesia Plan  ASA: 2  Anesthesia Plan: General   Post-op Pain Management: Tylenol PO (pre-op)*   Induction: Intravenous  PONV Risk Score and Plan: Scopolamine patch - Pre-op, Dexamethasone and Ondansetron  Airway Management Planned: LMA  Additional Equipment: None  Intra-op Plan:   Post-operative Plan:   Informed Consent: I have reviewed the patients History and Physical, chart, labs and discussed the procedure including the risks, benefits and alternatives for the proposed anesthesia with the patient or authorized representative who has indicated his/her understanding and acceptance.     Dental advisory given  Plan Discussed with: CRNA and Surgeon  Anesthesia Plan Comments: (PAT note written 07/21/2022 by Shonna Chock, PA-C. History JAK2 mutation with  essential thrombocythemia.  )       Anesthesia Quick Evaluation

## 2022-07-21 NOTE — Progress Notes (Addendum)
Anesthesia Chart Review:  Case: [id number] Date/Time: 07/29/22 0745   Procedure: EXCISION OF RIGHT BREAST FIBROADENOMA (Right) - LMA   Anesthesia type: General   Pre-op diagnosis: RIGHT BREAST FIBROADENOMA   [facility] ROOM 07 / [facility] OR   Surgeons: Manus Rudd, MD       DISCUSSION: Patient is a 27 year old female scheduled for the above procedure.  History includes never smoker, Janus Kinase (JAK2 mutation with essential thrombocythemia (on ASA, followed by hematology).  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  JAK2 thrombocythemia is followed by Dr. Myna Hidalgo. PLT count had been in the 600K range (up to 749K 04/18/20 and referred to hematology then), but down to 317K at her 05/05/22.  He wrote, "I am very surprised that her platelet count is where it is.  This is dropped by about half.  I looked at her blood under the microscope.  I really do not see anything that looked unusual or suspicious." Historically he has felt she had "low risk disease." Her 07/20/22 labs show PLT count back up to 729K.   She recently saw Noralee Space, MD for Preconception Consultation and was told pregnancy may be attempted after breast surgery if benign, but advised to continue low dose ASA and heparin anticoagulation for 6 weeks after delivery.    Known thrombocythemia, managed on baby ASA and followed by Dr. Myna Hidalgo. She was advised to contact Dr. Fatima Sanger office regarding perioperative ASA instructions. Her PLT count is 729K--previously this high in March 2022. I notified anesthesiologist Val Eagle, MD of result and sent communication to Dr. Corliss Skains to review as well. Her next scheduled visit with Dr. Myna Hidalgo is on 09/01/22. (UPDATE 07/21/22 5:22 PM: Dr. Corliss Skains has asked his staff to let her known to continue ASA for surgery.)  She is for urine pregnancy test on the day of surgery.    VS: BP 120/78   Pulse 78   Temp 37 C (Oral)   Resp 18   Ht 5\' 2"  (1.575 m)   Wt  61.1 kg   LMP 07/11/2022 (Exact Date)   SpO2 98%   BMI 24.62 kg/m    PROVIDERS: Pahwani, Kasandra Knudsen, MD Arlan Organ, MD is HEM   LABS: Preoperative labs noted. See DISCUSSION. (all labs ordered are listed, but only abnormal results are displayed)  Labs Reviewed  CBC - Abnormal; Notable for the following components:      Result Value   RBC 5.61 (*)    Hemoglobin 16.2 (*)    HCT 48.6 (*)    Platelets 729 (*)    All other components within normal limits     IMAGES: [place] Right breast 06/02/22: IMPRESSION: Interval marked increase in size of the biopsy-proven fibroadenoma involving the outer RIGHT breast since 2019, with measurements given above. RECOMMENDATION: Surgical consultation to discuss excision of the enlarging biopsy-proven fibroadenoma.    EKG: N/A   CV: N/A  Past Medical History:  Diagnosis Date   COVID 08/2019   Essential thrombocythemia (HCC) 06/09/2020   Goals of care, counseling/discussion 06/09/2020   JAK-2 gene mutation    Thrombocytosis 2022    Past Surgical History:  Procedure Laterality Date   BREAST SURGERY  12/23/2017   right breast biopsy - benign fibroadenoma   WISDOM TOOTH EXTRACTION      MEDICATIONS:  aspirin EC 81 MG tablet   norethindrone (MICRONOR) 0.35 MG tablet   No current facility-administered medications for this encounter.  Shonna Chock, PA-C Surgical Short Stay/Anesthesiology [facility] Phone [phone] [facility] Phone [phone] 07/21/2022 4:11 PM

## 2022-07-28 NOTE — Progress Notes (Signed)
Patient was called to inform that the surgery time for tomorrow was changed to 07:30 o'clock. Patient was instructed to be at the hospital at 05:30 o'clock and stop drinking clear liquids at 04:30 o'clock. Patient verbalized understanding. 

## 2022-07-29 ENCOUNTER — Ambulatory Visit (HOSPITAL_BASED_OUTPATIENT_CLINIC_OR_DEPARTMENT_OTHER): Payer: 59 | Admitting: Anesthesiology

## 2022-07-29 ENCOUNTER — Other Ambulatory Visit: Payer: Self-pay

## 2022-07-29 ENCOUNTER — Encounter (HOSPITAL_COMMUNITY)
Admission: RE | Disposition: A | Discharge status: Home / Self Care | Payer: Self-pay | Source: Home / Self Care | Attending: Surgery

## 2022-07-29 ENCOUNTER — Ambulatory Visit (HOSPITAL_COMMUNITY): Payer: 59 | Admitting: Physician Assistant

## 2022-07-29 ENCOUNTER — Other Ambulatory Visit (HOSPITAL_COMMUNITY): Payer: Self-pay

## 2022-07-29 ENCOUNTER — Ambulatory Visit (HOSPITAL_COMMUNITY)
Admission: RE | Admit: 2022-07-29 | Discharge: 2022-07-29 | Disposition: A | Discharge status: Home / Self Care | Payer: 59 | Attending: Surgery | Admitting: Surgery

## 2022-07-29 ENCOUNTER — Encounter (HOSPITAL_COMMUNITY): Payer: Self-pay | Admitting: Surgery

## 2022-07-29 DIAGNOSIS — D241 Benign neoplasm of right breast: Secondary | ICD-10-CM

## 2022-07-29 DIAGNOSIS — Z01818 Encounter for other preprocedural examination: Secondary | ICD-10-CM

## 2022-07-29 DIAGNOSIS — Z7982 Long term (current) use of aspirin: Secondary | ICD-10-CM | POA: Insufficient documentation

## 2022-07-29 DIAGNOSIS — D75839 Thrombocytosis, unspecified: Secondary | ICD-10-CM | POA: Diagnosis not present

## 2022-07-29 HISTORY — PX: BREAST CYST EXCISION: SHX579

## 2022-07-29 LAB — POCT PREGNANCY, URINE: Preg Test, Ur: NEGATIVE

## 2022-07-29 SURGERY — EXCISION, CYST, BREAST
Anesthesia: General | Site: Breast | Laterality: Right

## 2022-07-29 MED ORDER — HYDROMORPHONE HCL 1 MG/ML IJ SOLN: 0.2500 mg | INTRAMUSCULAR | Status: DC | PRN

## 2022-07-29 MED ORDER — MIDAZOLAM HCL 2 MG/2ML IJ SOLN: 0.5000 mg | Freq: Once | INTRAMUSCULAR | Status: DC | PRN

## 2022-07-29 MED ORDER — LIDOCAINE 2% (20 MG/ML) 5 ML SYRINGE
INTRAMUSCULAR | Status: DC | PRN
  Administered 2022-07-29: 20 mg via INTRAVENOUS

## 2022-07-29 MED ORDER — BUPIVACAINE-EPINEPHRINE 0.25% -1:200000 IJ SOLN
INTRAMUSCULAR | Status: DC | PRN
  Administered 2022-07-29: 10 mL

## 2022-07-29 MED ORDER — PROPOFOL 10 MG/ML IV BOLUS
INTRAVENOUS | Status: DC | PRN
  Administered 2022-07-29: 30 mg via INTRAVENOUS
  Administered 2022-07-29: 200 mg via INTRAVENOUS

## 2022-07-29 MED ORDER — CHLORHEXIDINE GLUCONATE CLOTH 2 % EX PADS: 6.0000 | MEDICATED_PAD | Freq: Once | CUTANEOUS | Status: DC

## 2022-07-29 MED ORDER — MEPERIDINE HCL 25 MG/ML IJ SOLN: 6.2500 mg | INTRAMUSCULAR | Status: DC | PRN

## 2022-07-29 MED ORDER — LACTATED RINGERS IV SOLN: INTRAVENOUS | Status: DC

## 2022-07-29 MED ORDER — BUPIVACAINE-EPINEPHRINE (PF) 0.25% -1:200000 IJ SOLN
INTRAMUSCULAR | Status: AC
  Filled 2022-07-29: qty 30

## 2022-07-29 MED ORDER — CEFAZOLIN SODIUM-DEXTROSE 2-4 GM/100ML-% IV SOLN
2.0000 g | INTRAVENOUS | Status: AC
  Administered 2022-07-29: 2 g via INTRAVENOUS
  Filled 2022-07-29: qty 100

## 2022-07-29 MED ORDER — MIDAZOLAM HCL 2 MG/2ML IJ SOLN
INTRAMUSCULAR | Status: DC | PRN
  Administered 2022-07-29: 2 mg via INTRAVENOUS

## 2022-07-29 MED ORDER — 0.9 % SODIUM CHLORIDE (POUR BTL) OPTIME
TOPICAL | Status: DC | PRN
  Administered 2022-07-29: 1000 mL

## 2022-07-29 MED ORDER — KETOROLAC TROMETHAMINE 30 MG/ML IJ SOLN
INTRAMUSCULAR | Status: DC | PRN
  Administered 2022-07-29: 15 mg via INTRAVENOUS

## 2022-07-29 MED ORDER — OXYCODONE HCL 5 MG PO TABS: 5.0000 mg | ORAL_TABLET | Freq: Once | ORAL | Status: DC | PRN

## 2022-07-29 MED ORDER — TRAMADOL HCL 50 MG PO TABS
50.0000 mg | ORAL_TABLET | Freq: Four times a day (QID) | ORAL | 0 refills | Status: DC | PRN
  Filled 2022-07-29: qty 20, 5d supply, fill #0

## 2022-07-29 MED ORDER — CHLORHEXIDINE GLUCONATE 0.12 % MT SOLN
15.0000 mL | Freq: Once | OROMUCOSAL | Status: AC
  Administered 2022-07-29: 15 mL via OROMUCOSAL
  Filled 2022-07-29: qty 15

## 2022-07-29 MED ORDER — FENTANYL CITRATE (PF) 250 MCG/5ML IJ SOLN
INTRAMUSCULAR | Status: DC | PRN
  Administered 2022-07-29 (×3): 50 ug via INTRAVENOUS

## 2022-07-29 MED ORDER — DEXAMETHASONE SODIUM PHOSPHATE 10 MG/ML IJ SOLN
INTRAMUSCULAR | Status: AC
  Filled 2022-07-29: qty 1

## 2022-07-29 MED ORDER — SCOPOLAMINE 1 MG/3DAYS TD PT72
1.0000 | MEDICATED_PATCH | TRANSDERMAL | Status: DC
  Administered 2022-07-29: 1.5 mg via TRANSDERMAL
  Filled 2022-07-29: qty 1

## 2022-07-29 MED ORDER — ONDANSETRON HCL 4 MG/2ML IJ SOLN
INTRAMUSCULAR | Status: DC | PRN
  Administered 2022-07-29: 4 mg via INTRAVENOUS

## 2022-07-29 MED ORDER — LIDOCAINE 2% (20 MG/ML) 5 ML SYRINGE
INTRAMUSCULAR | Status: AC
  Filled 2022-07-29: qty 5

## 2022-07-29 MED ORDER — ORAL CARE MOUTH RINSE: 15.0000 mL | Freq: Once | OROMUCOSAL | Status: AC

## 2022-07-29 MED ORDER — PHENYLEPHRINE HCL-NACL 20-0.9 MG/250ML-% IV SOLN
INTRAVENOUS | Status: DC | PRN
  Administered 2022-07-29: 20 ug/min via INTRAVENOUS

## 2022-07-29 MED ORDER — OXYCODONE HCL 5 MG/5ML PO SOLN: 5.0000 mg | Freq: Once | ORAL | Status: DC | PRN

## 2022-07-29 MED ORDER — FENTANYL CITRATE (PF) 250 MCG/5ML IJ SOLN
INTRAMUSCULAR | Status: AC
  Filled 2022-07-29: qty 5

## 2022-07-29 MED ORDER — PHENYLEPHRINE 80 MCG/ML (10ML) SYRINGE FOR IV PUSH (FOR BLOOD PRESSURE SUPPORT)
PREFILLED_SYRINGE | INTRAVENOUS | Status: AC
  Filled 2022-07-29: qty 10

## 2022-07-29 MED ORDER — PHENYLEPHRINE 80 MCG/ML (10ML) SYRINGE FOR IV PUSH (FOR BLOOD PRESSURE SUPPORT)
PREFILLED_SYRINGE | INTRAVENOUS | Status: DC | PRN
  Administered 2022-07-29: 120 ug via INTRAVENOUS
  Administered 2022-07-29: 160 ug via INTRAVENOUS
  Administered 2022-07-29: 120 ug via INTRAVENOUS
  Administered 2022-07-29: 80 ug via INTRAVENOUS

## 2022-07-29 MED ORDER — MIDAZOLAM HCL 2 MG/2ML IJ SOLN
INTRAMUSCULAR | Status: AC
  Filled 2022-07-29: qty 2

## 2022-07-29 MED ORDER — ONDANSETRON HCL 4 MG/2ML IJ SOLN
INTRAMUSCULAR | Status: AC
  Filled 2022-07-29: qty 2

## 2022-07-29 MED ORDER — ACETAMINOPHEN 500 MG PO TABS
1000.0000 mg | ORAL_TABLET | Freq: Once | ORAL | Status: AC
  Administered 2022-07-29: 1000 mg via ORAL
  Filled 2022-07-29: qty 2

## 2022-07-29 MED ORDER — PROMETHAZINE HCL 25 MG/ML IJ SOLN: 6.2500 mg | INTRAMUSCULAR | Status: DC | PRN

## 2022-07-29 MED ORDER — DEXAMETHASONE SODIUM PHOSPHATE 10 MG/ML IJ SOLN
INTRAMUSCULAR | Status: DC | PRN
  Administered 2022-07-29: 10 mg via INTRAVENOUS

## 2022-07-29 SURGICAL SUPPLY — 45 items
APL PRP STRL LF DISP 70% ISPRP (MISCELLANEOUS) ×1
APL SKNCLS STERI-STRIP NONHPOA (GAUZE/BANDAGES/DRESSINGS) ×1
APPLIER CLIP 9.375 MED OPEN (MISCELLANEOUS)
APR CLP MED 9.3 20 MLT OPN (MISCELLANEOUS)
BAG COUNTER SPONGE SURGICOUNT (BAG) ×1 IMPLANT
BAG SPNG CNTER NS LX DISP (BAG)
BENZOIN TINCTURE PRP APPL 2/3 (GAUZE/BANDAGES/DRESSINGS) ×1 IMPLANT
BINDER BREAST LRG (GAUZE/BANDAGES/DRESSINGS) IMPLANT
BINDER BREAST XLRG (GAUZE/BANDAGES/DRESSINGS) IMPLANT
CANISTER SUCT 3000ML PPV (MISCELLANEOUS) ×1 IMPLANT
CHLORAPREP W/TINT 26 (MISCELLANEOUS) ×1 IMPLANT
CLIP APPLIE 9.375 MED OPEN (MISCELLANEOUS) IMPLANT
COVER PROBE W GEL 5X96 (DRAPES) ×1 IMPLANT
COVER SURGICAL LIGHT HANDLE (MISCELLANEOUS) ×1 IMPLANT
DEVICE DUBIN SPECIMEN MAMMOGRA (MISCELLANEOUS) ×1 IMPLANT
DRAPE CHEST BREAST 15X10 FENES (DRAPES) ×1 IMPLANT
DRSG TEGADERM 4X4.75 (GAUZE/BANDAGES/DRESSINGS) ×1 IMPLANT
ELECT CAUTERY BLADE 6.4 (BLADE) ×1 IMPLANT
ELECT REM PT RETURN 9FT ADLT (ELECTROSURGICAL) ×1
ELECTRODE REM PT RTRN 9FT ADLT (ELECTROSURGICAL) ×1 IMPLANT
GAUZE SPONGE 2X2 8PLY STRL LF (GAUZE/BANDAGES/DRESSINGS) ×1 IMPLANT
GAUZE SPONGE 2X2 STRL 8-PLY (GAUZE/BANDAGES/DRESSINGS) IMPLANT
GAUZE SPONGE 4X4 12PLY STRL (GAUZE/BANDAGES/DRESSINGS) IMPLANT
GLOVE BIO SURGEON STRL SZ7 (GLOVE) ×1 IMPLANT
GLOVE BIOGEL PI IND STRL 7.5 (GLOVE) ×1 IMPLANT
GOWN STRL REUS W/ TWL LRG LVL3 (GOWN DISPOSABLE) ×2 IMPLANT
GOWN STRL REUS W/TWL LRG LVL3 (GOWN DISPOSABLE) ×2
ILLUMINATOR WAVEGUIDE N/F (MISCELLANEOUS) IMPLANT
KIT BASIN OR (CUSTOM PROCEDURE TRAY) ×1 IMPLANT
KIT MARKER MARGIN INK (KITS) ×1 IMPLANT
LIGHT WAVEGUIDE WIDE FLAT (MISCELLANEOUS) IMPLANT
NDL HYPO 25GX1X1/2 BEV (NEEDLE) ×1 IMPLANT
NEEDLE HYPO 25GX1X1/2 BEV (NEEDLE) ×1 IMPLANT
NS IRRIG 1000ML POUR BTL (IV SOLUTION) ×1 IMPLANT
PACK GENERAL/GYN (CUSTOM PROCEDURE TRAY) ×1 IMPLANT
SPONGE T-LAP 4X18 ~~LOC~~+RFID (SPONGE) ×1 IMPLANT
STRIP CLOSURE SKIN 1/2X4 (GAUZE/BANDAGES/DRESSINGS) ×1 IMPLANT
SUT MNCRL AB 4-0 PS2 18 (SUTURE) ×1 IMPLANT
SUT PROLENE 2 0 CT2 30 (SUTURE) IMPLANT
SUT SILK 2 0 SH (SUTURE) IMPLANT
SUT VIC AB 3-0 SH 27 (SUTURE) ×1
SUT VIC AB 3-0 SH 27X BRD (SUTURE) ×1 IMPLANT
SYR CONTROL 10ML LL (SYRINGE) ×1 IMPLANT
TOWEL GREEN STERILE (TOWEL DISPOSABLE) ×1 IMPLANT
TOWEL GREEN STERILE FF (TOWEL DISPOSABLE) ×1 IMPLANT

## 2022-07-29 NOTE — Transfer of Care (Signed)
Immediate Anesthesia Transfer of Care Note  Patient: Lori [name]  Procedure(s) Performed: EXCISION OF RIGHT BREAST FIBROADENOMA (Right: Breast)  Patient Location: PACU  Anesthesia Type:General  Level of Consciousness: awake  Airway & Oxygen Therapy: Patient Spontanous Breathing  Post-op Assessment: Report given to RN, Post -op Vital signs reviewed and stable, and Patient moving all extremities X 4  Post vital signs: Reviewed and stable  Last Vitals:  Vitals Value Taken Time  BP 102/76 07/29/22 0847  Temp    Pulse 109 07/29/22 0848  Resp 11 07/29/22 0848  SpO2 100 % 07/29/22 0848  Vitals shown include unvalidated device data.  Last Pain:  Vitals:   07/29/22 0618  TempSrc:   PainSc: 0-No pain      Patients Stated Pain Goal: 0 (07/29/22 0618)  Complications: There were no known notable events for this encounter.

## 2022-07-29 NOTE — Interval H&P Note (Signed)
History and Physical Interval Note:  07/29/2022 7:17 AM  Lori [name]  has presented today for surgery, with the diagnosis of RIGHT BREAST FIBROADENOMA.  The various methods of treatment have been discussed with the patient and family. After consideration of risks, benefits and other options for treatment, the patient has consented to  Procedure(s) with comments: EXCISION OF RIGHT BREAST FIBROADENOMA (Right) - LMA as a surgical intervention.  The patient's history has been reviewed, patient examined, no change in status, stable for surgery.  I have reviewed the patient's chart and labs.  Questions were answered to the patient's satisfaction.     Lori [name]

## 2022-07-29 NOTE — Discharge Instructions (Addendum)
[facility] Office Phone Number [phone]  BREAST BIOPSY/ PARTIAL MASTECTOMY: POST OP INSTRUCTIONS  Always review your discharge instruction sheet given to you by the facility where your surgery was performed.  IF YOU HAVE DISABILITY OR FAMILY LEAVE FORMS, YOU MUST BRING THEM TO THE OFFICE FOR PROCESSING.  DO NOT GIVE THEM TO YOUR DOCTOR.  A prescription for pain medication may be given to you upon discharge.  Take your pain medication as prescribed, if needed.  If narcotic pain medicine is not needed, then you may take acetaminophen (Tylenol) or ibuprofen (Advil) as needed. Take your usually prescribed medications unless otherwise directed If you need a refill on your pain medication, please contact your pharmacy.  They will contact our office to request authorization.  Prescriptions will not be filled after 5pm or on week-ends. You should eat very light the first 24 hours after surgery, such as soup, crackers, pudding, etc.  Resume your normal diet the day after surgery. Most patients will experience some swelling and bruising in the breast.  Ice packs and a good support bra will help.  Swelling and bruising can take several days to resolve.  It is common to experience some constipation if taking pain medication after surgery.  Increasing fluid intake and taking a stool softener will usually help or prevent this problem from occurring.  A mild laxative (Milk of Magnesia or Miralax) should be taken according to package directions if there are no bowel movements after 48 hours. Unless discharge instructions indicate otherwise, you may remove your bandages 48 hours after surgery, and you may shower at that time.  You may have steri-strips (small skin tapes) in place directly over the incision.  These strips should be left on the skin for 7-10 days.   ACTIVITIES:  You may resume regular daily activities (gradually increasing) beginning the next day.  Wearing a good support bra or  sports bra minimizes pain and swelling.  You may have sexual intercourse when it is comfortable. You may drive when you no longer are taking prescription pain medication, you can comfortably wear a seatbelt, and you can safely maneuver your car and apply brakes. RETURN TO WORK:  ______________________________________________________________________________________ You should see your doctor in the office for a follow-up appointment approximately two weeks after your surgery.  Your doctor's nurse will typically make your follow-up appointment when she calls you with your pathology report.  Expect your pathology report 2-3 business days after your surgery.  You may call to check if you do not hear from us after three days. OTHER INSTRUCTIONS: _______________________________________________________________________________________________ _____________________________________________________________________________________________________________________________________ _____________________________________________________________________________________________________________________________________ _____________________________________________________________________________________________________________________________________  WHEN TO CALL YOUR DOCTOR: Fever over 101.0 Nausea and/or vomiting. Extreme swelling or bruising. Continued bleeding from incision. Increased pain, redness, or drainage from the incision.  The clinic staff is available to answer your questions during regular business hours.  Please don't hesitate to call and ask to speak to one of the nurses for clinical concerns.  If you have a medical emergency, go to the nearest emergency room or call 911.  A surgeon from [facility] is always on call at the hospital.  For further questions, please visit [email or web address]   

## 2022-07-29 NOTE — Op Note (Signed)
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  The patient has had a known right breast mass for at least 7 years.  This has been followed with ultrasound as well as a previous biopsy.  In October 2017, this mesh measured 2.0 x 0.8 x 1.8 cm.  In 2019, this measured 3.0 x 1.3 x 2.8 cm.  At that time, she underwent biopsy of this area that revealed a fibroadenoma.  The mass is located at 9:00 in the right breast 2 cm from the nipple.  The patient feels that over the last year, the mass has become noticeably larger.  She had another ultrasound recently that measured this mass at 7.8 x 2.6 x 6.0 cm.  She presents now to discuss excision.  The mass is getting to cause some mild distortion in the contour of her breast.  There is minimal discomfort.  No nipple discharge.  No family history of breast cancer.   The patient is on aspirin for thrombocythemia  Operative findings: 8 x 6 x 3 cm right breast fibroadenoma Description of procedure: The patient is brought to the operating room placed in the supine position on the operating room table.  After an adequate level general anesthesia was obtained, her right breast was prepped with ChloraPrep and draped in sterile fashion.  A timeout was taken to ensure the proper patient and proper procedure.  We infiltrated the area around the lateral side of the nipple with local anesthetic.  I made a circumareolar incision around the lateral side of the right nipple.  We carried our dissection down through the dermis into the breast tissue.  We quickly encountered the surface of the fibroadenoma.  We then dissected the entire fibroadenoma out of the breast using cautery and blunt dissection.  The  fibroadenoma was left completely intact.  We sent the fibroadenoma for pathologic examination.  We inspected the wound carefully for hemostasis.  We irrigated thoroughly.  The wound was closed with a deep layer of 3-0 Vicryl and a subcuticular layer 4-0 Monocryl.  Benzoin and Steri-Strips were applied.  A dry dressing is applied.  The patient was then extubated and brought to the recovery room in stable condition.  All sponge, instrument, and needle counts are correct.  Wilmon Arms. Corliss Skains, MD, [facility]  General Surgery   07/29/2022 8:41 AM

## 2022-07-29 NOTE — Anesthesia Procedure Notes (Signed)
Procedure Name: LMA Insertion Date/Time: 07/29/2022 7:37 AM  Performed by: Sharyn Dross, CRNAPre-anesthesia Checklist: Patient identified, Emergency Drugs available, Suction available and Patient being monitored Patient Re-evaluated:Patient Re-evaluated prior to induction Oxygen Delivery Method: Circle system utilized Preoxygenation: Pre-oxygenation with 100% oxygen Induction Type: IV induction Ventilation: Mask ventilation without difficulty LMA: LMA inserted LMA Size: 4.0 Number of attempts: 1 Placement Confirmation: positive ETCO2 and breath sounds checked- equal and bilateral Tube secured with: Tape Dental Injury: Teeth and Oropharynx as per pre-operative assessment

## 2022-07-29 NOTE — Anesthesia Postprocedure Evaluation (Signed)
Anesthesia Post Note  Patient: Lori [name]  Procedure(s) Performed: EXCISION OF RIGHT BREAST FIBROADENOMA (Right: Breast)     Patient location during evaluation: PACU Anesthesia Type: General Level of consciousness: awake and alert, patient cooperative and oriented Pain management: pain level controlled Vital Signs Assessment: post-procedure vital signs reviewed and stable Respiratory status: spontaneous breathing, nonlabored ventilation and respiratory function stable Cardiovascular status: blood pressure returned to baseline and stable Postop Assessment: no apparent nausea or vomiting and able to ambulate Anesthetic complications: no   There were no known notable events for this encounter.  Last Vitals:  Vitals:   07/29/22 0900 07/29/22 0911  BP: (!) 155/77 119/76  Pulse: (!) 101 100  Resp: 18   Temp:  (!) 36.4 C  SpO2: 99% 99%    Last Pain:  Vitals:   07/29/22 0847  TempSrc:   PainSc: 1                  Jayvyn Haselton,E. Aaiden Depoy

## 2022-07-30 ENCOUNTER — Encounter (HOSPITAL_COMMUNITY): Payer: Self-pay | Admitting: Surgery

## 2022-07-30 LAB — SURGICAL PATHOLOGY

## 2022-09-01 ENCOUNTER — Inpatient Hospital Stay (HOSPITAL_BASED_OUTPATIENT_CLINIC_OR_DEPARTMENT_OTHER): Payer: 59 | Admitting: Hematology & Oncology

## 2022-09-01 ENCOUNTER — Encounter: Payer: Self-pay | Admitting: Hematology & Oncology

## 2022-09-01 ENCOUNTER — Inpatient Hospital Stay: Payer: 59 | Attending: Hematology & Oncology

## 2022-09-01 VITALS — BP 126/85 | HR 79 | Temp 98.4°F | Resp 17 | Wt 134.1 lb

## 2022-09-01 DIAGNOSIS — Z79899 Other long term (current) drug therapy: Secondary | ICD-10-CM | POA: Diagnosis not present

## 2022-09-01 DIAGNOSIS — Z7982 Long term (current) use of aspirin: Secondary | ICD-10-CM | POA: Insufficient documentation

## 2022-09-01 DIAGNOSIS — D473 Essential (hemorrhagic) thrombocythemia: Secondary | ICD-10-CM | POA: Insufficient documentation

## 2022-09-01 LAB — CMP (CANCER CENTER ONLY)
ALT: 14 U/L (ref 0–44)
AST: 14 U/L — ABNORMAL LOW (ref 15–41)
Albumin: 4.6 g/dL (ref 3.5–5.0)
Alkaline Phosphatase: 73 U/L (ref 38–126)
Anion gap: 6 (ref 5–15)
BUN: 14 mg/dL (ref 6–20)
CO2: 30 mmol/L (ref 22–32)
Calcium: 9.3 mg/dL (ref 8.9–10.3)
Chloride: 102 mmol/L (ref 98–111)
Creatinine: 0.72 mg/dL (ref 0.44–1.00)
GFR, Estimated: >60 mL/min (ref >60)
Glucose, Bld: 100 mg/dL — ABNORMAL HIGH (ref 70–99)
Potassium: 4.1 mmol/L (ref 3.5–5.1)
Sodium: 138 mmol/L (ref 135–145)
Total Bilirubin: 0.6 mg/dL (ref 0.3–1.2)
Total Protein: 7.1 g/dL (ref 6.5–8.1)

## 2022-09-01 LAB — CBC WITH DIFFERENTIAL (CANCER CENTER ONLY)
Abs Immature Granulocytes: 0.05 10*3/uL (ref 0.00–0.07)
Basophils Absolute: 0.1 10*3/uL (ref 0.0–0.1)
Basophils Relative: 1 %
Eosinophils Absolute: 0.3 10*3/uL (ref 0.0–0.5)
Eosinophils Relative: 4 %
HCT: 46 % (ref 36.0–46.0)
Hemoglobin: 15.5 g/dL — ABNORMAL HIGH (ref 12.0–15.0)
Immature Granulocytes: 1 %
Lymphocytes Relative: 20 %
Lymphs Abs: 1.8 10*3/uL (ref 0.7–4.0)
MCH: 29 pg (ref 26.0–34.0)
MCHC: 33.7 g/dL (ref 30.0–36.0)
MCV: 86.1 fL (ref 80.0–100.0)
Monocytes Absolute: 0.5 10*3/uL (ref 0.1–1.0)
Monocytes Relative: 5 %
Neutro Abs: 6.4 10*3/uL (ref 1.7–7.7)
Neutrophils Relative %: 69 %
Platelet Count: 642 10*3/uL — ABNORMAL HIGH (ref 150–400)
RBC: 5.34 MIL/uL — ABNORMAL HIGH (ref 3.87–5.11)
RDW: 13 % (ref 11.5–15.5)
WBC Count: 9.1 10*3/uL (ref 4.0–10.5)
nRBC: 0 % (ref 0.0–0.2)

## 2022-09-01 LAB — SAVE SMEAR(SSMR), FOR PROVIDER SLIDE REVIEW

## 2022-09-01 LAB — LACTATE DEHYDROGENASE: LDH: 254 U/L — ABNORMAL HIGH (ref 98–192)

## 2022-09-01 NOTE — Progress Notes (Signed)
Hematology and Oncology Follow Up Visit  Lori [name] [id number] 1995/03/10 27 y.o. 09/01/2022   Principle Diagnosis:  Essential thrombocythemia --JAK2 positive  Current Therapy:   Aspirin 81 mg p.o. daily     Interim History:  Lori [name] is back for follow-up.  I am absolutely shocked that she had a lumpectomy.  She had a growing mass in the right breast.  She has had this for several years.  She ultimately had a lumpectomy on 07/29/2022.  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  Thankfully, there is no malignancy.  Otherwise, she is doing quite nicely.  She is working quite a bit.  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  She has had no problems with pain.  There is been no cough or shortness of breath.  There is no nausea or vomiting.  She still has her monthly cycles.  There is been no rashes.  She has had no change in bowel or bladder habits.  When we last saw her back in April, her ferritin was 82 with an iron saturation of 30%.  Overall, I would have said that her performance status is probably ECOG 0.    Medications:  Current Outpatient Medications:    aspirin EC 81 MG tablet, Take 81 mg by mouth daily. Swallow whole., Disp: , Rfl:    norethindrone (MICRONOR) 0.35 MG tablet, Take 1 tablet (0.35 mg total) by mouth daily., Disp: 84 tablet, Rfl: 3   traMADol (ULTRAM) 50 MG tablet, Take 1 tablet (50 mg total) by mouth every 6 (six) hours as needed for moderate pain or severe pain., Disp: 20 tablet, Rfl: 0  Allergies: No Known Allergies  Past Medical History, Surgical history, Social history, and Family History were reviewed and updated.  Review of Systems: Review of Systems  Constitutional: Negative.   HENT:  Negative.    Eyes: Negative.   Respiratory: Negative.    Cardiovascular: Negative.   Gastrointestinal: Negative.   Endocrine: Negative.   Genitourinary: Negative.    Musculoskeletal: Negative.   Skin: Negative.    Neurological: Negative.   Hematological: Negative.   Psychiatric/Behavioral: Negative.      Physical Exam:  weight is 134 lb 1.9 oz (60.8 kg). Her oral temperature is 98.4 F (36.9 C). Her blood pressure is 126/85 and her pulse is 79. Her respiration is 17 and oxygen saturation is 100%.   Wt Readings from Last 3 Encounters:  09/01/22 134 lb 1.9 oz (60.8 kg)  07/29/22 130 lb (59 kg)  07/20/22 134 lb 9.6 oz (61.1 kg)    Physical Exam Vitals reviewed.  HENT:     Head: Normocephalic and atraumatic.  Eyes:     Pupils: Pupils are equal, round, and reactive to light.  Cardiovascular:     Rate and Rhythm: Normal rate and regular rhythm.     Heart sounds: Normal heart sounds.  Pulmonary:     Effort: Pulmonary effort is normal.     Breath sounds: Normal breath sounds.  Abdominal:     General: Bowel sounds are normal.     Palpations: Abdomen is soft.  Musculoskeletal:        General: No tenderness or deformity. Normal range of motion.     Cervical back: Normal range of motion.  Lymphadenopathy:     Cervical: No cervical adenopathy.  Skin:    General: Skin is warm and dry.     Findings: No erythema or rash.  Neurological:  Mental Status: She is alert and oriented to person, place, and time.  Psychiatric:        Behavior: Behavior normal.        Thought Content: Thought content normal.        Judgment: Judgment normal.      Lab Results  Component Value Date   WBC 9.1 09/01/2022   HGB 15.5 (H) 09/01/2022   HCT 46.0 09/01/2022   MCV 86.1 09/01/2022   PLT 642 (H) 09/01/2022     Chemistry      Component Value Date/Time   NA 137 05/05/2022 1512   K 4.2 05/05/2022 1512   CL 103 05/05/2022 1512   CO2 26 05/05/2022 1512   BUN 14 05/05/2022 1512   CREATININE 0.71 05/05/2022 1512   CREATININE 0.71 04/18/2020 1620      Component Value Date/Time   CALCIUM 8.5 (L) 05/05/2022 1512   ALKPHOS 65 05/05/2022 1512   AST 16 05/05/2022 1512   ALT 27 05/05/2022 1512    BILITOT 0.4 05/05/2022 1512       Impression and Plan: Lori [name] is a very charming 27 year old female.  She has essential thrombocythemia.  She is JAK2 positive.  She has low risk disease.   Again, I am just shocked that she had this lumpectomy.  Again thankfully, there is no malignancy.  I think that everything is holding steady.  I looked at her blood smear under the microscope.  I do not see anything that looked suspicious for progressive type of disease.  We will plan to get her back in early November.  If all looks good, then we will certainly get her through the Holiday season.   Josph Macho, MD 7/31/20243:42 PMI

## 2022-12-01 ENCOUNTER — Encounter: Payer: Self-pay | Admitting: Hematology & Oncology

## 2022-12-01 ENCOUNTER — Inpatient Hospital Stay: Payer: 59 | Attending: Hematology & Oncology

## 2022-12-01 ENCOUNTER — Inpatient Hospital Stay: Payer: 59 | Admitting: Hematology & Oncology

## 2022-12-01 VITALS — BP 122/83 | HR 77 | Temp 98.4°F | Resp 17 | Wt 135.1 lb

## 2022-12-01 DIAGNOSIS — Z79899 Other long term (current) drug therapy: Secondary | ICD-10-CM | POA: Insufficient documentation

## 2022-12-01 DIAGNOSIS — Z7982 Long term (current) use of aspirin: Secondary | ICD-10-CM | POA: Diagnosis not present

## 2022-12-01 DIAGNOSIS — D473 Essential (hemorrhagic) thrombocythemia: Secondary | ICD-10-CM

## 2022-12-01 LAB — CBC WITH DIFFERENTIAL (CANCER CENTER ONLY)
Abs Immature Granulocytes: 0.03 10*3/uL (ref 0.00–0.07)
Basophils Absolute: 0.1 10*3/uL (ref 0.0–0.1)
Basophils Relative: 1 %
Eosinophils Absolute: 0.3 10*3/uL (ref 0.0–0.5)
Eosinophils Relative: 4 %
HCT: 48.9 % — ABNORMAL HIGH (ref 36.0–46.0)
Hemoglobin: 16.4 g/dL — ABNORMAL HIGH (ref 12.0–15.0)
Immature Granulocytes: 0 %
Lymphocytes Relative: 22 %
Lymphs Abs: 2 10*3/uL (ref 0.7–4.0)
MCH: 28.4 pg (ref 26.0–34.0)
MCHC: 33.5 g/dL (ref 30.0–36.0)
MCV: 84.7 fL (ref 80.0–100.0)
Monocytes Absolute: 0.5 10*3/uL (ref 0.1–1.0)
Monocytes Relative: 6 %
Neutro Abs: 5.8 10*3/uL (ref 1.7–7.7)
Neutrophils Relative %: 67 %
Platelet Count: 657 10*3/uL — ABNORMAL HIGH (ref 150–400)
RBC: 5.77 MIL/uL — ABNORMAL HIGH (ref 3.87–5.11)
RDW: 13.5 % (ref 11.5–15.5)
WBC Count: 8.7 10*3/uL (ref 4.0–10.5)
nRBC: 0 % (ref 0.0–0.2)

## 2022-12-01 LAB — CMP (CANCER CENTER ONLY)
ALT: 11 U/L (ref 0–44)
AST: 9 U/L — ABNORMAL LOW (ref 15–41)
Albumin: 4.8 g/dL (ref 3.5–5.0)
Alkaline Phosphatase: 63 U/L (ref 38–126)
Anion gap: 7 (ref 5–15)
BUN: 16 mg/dL (ref 6–20)
CO2: 26 mmol/L (ref 22–32)
Calcium: 9 mg/dL (ref 8.9–10.3)
Chloride: 105 mmol/L (ref 98–111)
Creatinine: 0.65 mg/dL (ref 0.44–1.00)
GFR, Estimated: >60 mL/min (ref >60)
Glucose, Bld: 103 mg/dL — ABNORMAL HIGH (ref 70–99)
Potassium: 4.6 mmol/L (ref 3.5–5.1)
Sodium: 138 mmol/L (ref 135–145)
Total Bilirubin: 0.5 mg/dL (ref 0.3–1.2)
Total Protein: 6.9 g/dL (ref 6.5–8.1)

## 2022-12-01 LAB — FERRITIN: Ferritin: 72 ng/mL (ref 11–307)

## 2022-12-01 LAB — SAVE SMEAR(SSMR), FOR PROVIDER SLIDE REVIEW

## 2022-12-01 LAB — LACTATE DEHYDROGENASE: LDH: 206 U/L — ABNORMAL HIGH (ref 98–192)

## 2022-12-01 NOTE — Progress Notes (Signed)
Hematology and Oncology Follow Up Visit  Lori [name] [id number] 1995/03/17 27 y.o. 12/01/2022   Principle Diagnosis:  Essential thrombocythemia --JAK2 positive  Current Therapy:   Aspirin 81 mg p.o. daily     Interim History:  Lori [name] is back for follow-up.  She is doing quite well.  She had a nice summer.  She is looking forward to the Holiday season.  She is still working at [employer] in the [employer].  She is quite busy over there.  She has had no problems with fever.  She has had no issues with COVID.  She has had no change in bowel or bladder habits.  She still has her monthly cycles..  She has had no problems with rashes.  There has been no leg swelling.  She and her husband exercise.  She has had no sweats.  Her last ultrasound of the spleen was back in 2022.  I really think we probably need to get another one so we can see if there is any change in splenomegaly.  Her last iron studies that were done back in April showed a ferritin of 82 with an iron saturation of 30%.  Overall, I would say performance status is probably ECOG 0.   Medications:  Current Outpatient Medications:    aspirin EC 81 MG tablet, Take 81 mg by mouth daily. Swallow whole., Disp: , Rfl:    norethindrone (MICRONOR) 0.35 MG tablet, Take 1 tablet (0.35 mg total) by mouth daily., Disp: 84 tablet, Rfl: 3   traMADol (ULTRAM) 50 MG tablet, Take 1 tablet (50 mg total) by mouth every 6 (six) hours as needed for moderate pain or severe pain., Disp: 20 tablet, Rfl: 0  Allergies: No Known Allergies  Past Medical History, Surgical history, Social history, and Family History were reviewed and updated.  Review of Systems: Review of Systems  Constitutional: Negative.   HENT:  Negative.    Eyes: Negative.   Respiratory: Negative.    Cardiovascular: Negative.   Gastrointestinal: Negative.   Endocrine: Negative.   Genitourinary: Negative.    Musculoskeletal: Negative.   Skin: Negative.    Neurological: Negative.   Hematological: Negative.   Psychiatric/Behavioral: Negative.      Physical Exam:  weight is 135 lb 1.9 oz (61.3 kg). Her oral temperature is 98.4 F (36.9 C). Her blood pressure is 122/83 and her pulse is 77. Her respiration is 17 and oxygen saturation is 100%.   Wt Readings from Last 3 Encounters:  12/01/22 135 lb 1.9 oz (61.3 kg)  09/01/22 134 lb 1.9 oz (60.8 kg)  07/29/22 130 lb (59 kg)    Physical Exam Vitals reviewed.  HENT:     Head: Normocephalic and atraumatic.  Eyes:     Pupils: Pupils are equal, round, and reactive to light.  Cardiovascular:     Rate and Rhythm: Normal rate and regular rhythm.     Heart sounds: Normal heart sounds.  Pulmonary:     Effort: Pulmonary effort is normal.     Breath sounds: Normal breath sounds.  Abdominal:     General: Bowel sounds are normal.     Palpations: Abdomen is soft.  Musculoskeletal:        General: No tenderness or deformity. Normal range of motion.     Cervical back: Normal range of motion.  Lymphadenopathy:     Cervical: No cervical adenopathy.  Skin:    General: Skin is warm and dry.     Findings: No erythema or  rash.  Neurological:     Mental Status: She is alert and oriented to person, place, and time.  Psychiatric:        Behavior: Behavior normal.        Thought Content: Thought content normal.        Judgment: Judgment normal.      Lab Results  Component Value Date   WBC 8.7 12/01/2022   HGB 16.4 (H) 12/01/2022   HCT 48.9 (H) 12/01/2022   MCV 84.7 12/01/2022   PLT 657 (H) 12/01/2022     Chemistry      Component Value Date/Time   NA 138 09/01/2022 1508   K 4.1 09/01/2022 1508   CL 102 09/01/2022 1508   CO2 30 09/01/2022 1508   BUN 14 09/01/2022 1508   CREATININE 0.72 09/01/2022 1508   CREATININE 0.71 04/18/2020 1620      Component Value Date/Time   CALCIUM 9.3 09/01/2022 1508   ALKPHOS 73 09/01/2022 1508   AST 14 (L) 09/01/2022 1508   ALT 14 09/01/2022 1508    BILITOT 0.6 09/01/2022 1508       Impression and Plan: Lori [name] is a very charming 27 year old female.  She has essential thrombocythemia.  She is JAK2 positive.  She has low risk disease.   So far, everything is looking quite good.  I looked at her blood smear under the microscope.  I really do not see anything that looked suspicious for any type of progressive or active disease.  Again, we really do need to get a ultrasound of her spleen.  We will set this up for couple weeks.  I will plan to get her back in 4 more months.  She and her husband are still not decided yet have as to when to start planning for a family.  I want to make sure that we are prepared ourselves to make sure that if she does become pregnant, that it will be safe for her and for the baby.  Josph Macho, MD 10/30/20243:43 PMI

## 2022-12-02 LAB — IRON AND IRON BINDING CAPACITY (CC-WL,HP ONLY)
Iron: 85 ug/dL (ref 28–170)
Saturation Ratios: 21 % (ref 10.4–31.8)
TIBC: 400 ug/dL (ref 250–450)
UIBC: 315 ug/dL (ref 148–442)

## 2022-12-24 ENCOUNTER — Ambulatory Visit (HOSPITAL_COMMUNITY)
Admission: RE | Admit: 2022-12-24 | Discharge: 2022-12-24 | Disposition: A | Discharge status: Home / Self Care | Payer: 59 | Source: Ambulatory Visit | Attending: Hematology & Oncology | Admitting: Hematology & Oncology

## 2022-12-24 DIAGNOSIS — D473 Essential (hemorrhagic) thrombocythemia: Secondary | ICD-10-CM | POA: Diagnosis not present

## 2022-12-24 DIAGNOSIS — R161 Splenomegaly, not elsewhere classified: Secondary | ICD-10-CM | POA: Diagnosis not present

## 2022-12-27 ENCOUNTER — Encounter: Payer: Self-pay | Admitting: *Deleted

## 2023-02-22 ENCOUNTER — Other Ambulatory Visit (HOSPITAL_COMMUNITY): Payer: Self-pay

## 2023-03-29 ENCOUNTER — Other Ambulatory Visit: Payer: Self-pay | Admitting: *Deleted

## 2023-03-29 DIAGNOSIS — D473 Essential (hemorrhagic) thrombocythemia: Secondary | ICD-10-CM

## 2023-03-30 ENCOUNTER — Encounter: Payer: Self-pay | Admitting: Hematology & Oncology

## 2023-03-30 ENCOUNTER — Inpatient Hospital Stay: Payer: Commercial Managed Care - PPO | Attending: Hematology & Oncology

## 2023-03-30 ENCOUNTER — Other Ambulatory Visit: Payer: Self-pay

## 2023-03-30 ENCOUNTER — Inpatient Hospital Stay: Payer: Commercial Managed Care - PPO | Admitting: Hematology & Oncology

## 2023-03-30 VITALS — BP 122/87 | HR 79 | Temp 99.2°F | Resp 16 | Ht 62.0 in | Wt 136.0 lb

## 2023-03-30 DIAGNOSIS — D473 Essential (hemorrhagic) thrombocythemia: Secondary | ICD-10-CM | POA: Insufficient documentation

## 2023-03-30 DIAGNOSIS — Z7982 Long term (current) use of aspirin: Secondary | ICD-10-CM | POA: Insufficient documentation

## 2023-03-30 LAB — CBC WITH DIFFERENTIAL (CANCER CENTER ONLY)
Abs Immature Granulocytes: 0.05 10*3/uL (ref 0.00–0.07)
Basophils Absolute: 0.1 10*3/uL (ref 0.0–0.1)
Basophils Relative: 1 %
Eosinophils Absolute: 0.4 10*3/uL (ref 0.0–0.5)
Eosinophils Relative: 4 %
HCT: 46.6 % — ABNORMAL HIGH (ref 36.0–46.0)
Hemoglobin: 16.2 g/dL — ABNORMAL HIGH (ref 12.0–15.0)
Immature Granulocytes: 1 %
Lymphocytes Relative: 23 %
Lymphs Abs: 2.1 10*3/uL (ref 0.7–4.0)
MCH: 29.2 pg (ref 26.0–34.0)
MCHC: 34.8 g/dL (ref 30.0–36.0)
MCV: 84.1 fL (ref 80.0–100.0)
Monocytes Absolute: 0.6 10*3/uL (ref 0.1–1.0)
Monocytes Relative: 6 %
Neutro Abs: 6 10*3/uL (ref 1.7–7.7)
Neutrophils Relative %: 65 %
Platelet Count: 656 10*3/uL — ABNORMAL HIGH (ref 150–400)
RBC: 5.54 MIL/uL — ABNORMAL HIGH (ref 3.87–5.11)
RDW: 13.3 % (ref 11.5–15.5)
WBC Count: 9.3 10*3/uL (ref 4.0–10.5)
nRBC: 0 % (ref 0.0–0.2)

## 2023-03-30 LAB — CMP (CANCER CENTER ONLY)
ALT: 18 U/L (ref 0–44)
AST: 15 U/L (ref 15–41)
Albumin: 4.8 g/dL (ref 3.5–5.0)
Alkaline Phosphatase: 57 U/L (ref 38–126)
Anion gap: 7 (ref 5–15)
BUN: 14 mg/dL (ref 6–20)
CO2: 28 mmol/L (ref 22–32)
Calcium: 9.4 mg/dL (ref 8.9–10.3)
Chloride: 102 mmol/L (ref 98–111)
Creatinine: 0.71 mg/dL (ref 0.44–1.00)
GFR, Estimated: >60 mL/min (ref >60)
Glucose, Bld: 97 mg/dL (ref 70–99)
Potassium: 4.1 mmol/L (ref 3.5–5.1)
Sodium: 137 mmol/L (ref 135–145)
Total Bilirubin: 0.6 mg/dL (ref 0.0–1.2)
Total Protein: 6.6 g/dL (ref 6.5–8.1)

## 2023-03-30 LAB — LACTATE DEHYDROGENASE: LDH: 232 U/L — ABNORMAL HIGH (ref 98–192)

## 2023-03-30 LAB — FERRITIN: Ferritin: 125 ng/mL (ref 11–307)

## 2023-03-30 LAB — SAVE SMEAR(SSMR), FOR PROVIDER SLIDE REVIEW

## 2023-03-30 NOTE — Progress Notes (Signed)
 Hematology and Oncology Follow Up Visit  Lori [name] [id number] 1996-01-22 28 y.o. 03/30/2023   Principle Diagnosis:  Essential thrombocythemia --JAK2 positive  Current Therapy:   Aspirin 81 mg p.o. daily     Interim History:  Lori [name] is back for follow-up.  As always, she is doing quite well.  We last saw her back in October.  She and her husband had a wonderful holiday season.  I think they are up and [place] [facility].  She is still very busy at [employer]   She is doing speech pathology for newborns.  She is exercising.  She is staying quite active.  She is having no problems with her monthly cycles.  She has had no issues with nausea or vomiting.  She has had no change in bowel or bladder habits.  She has had no pain in her hands or feet.  She has had no headaches.  She has had no rashes.  She has had no leg swelling.  When we last saw her, her ferritin was 72 with an iron saturation of 21%.  Overall, I would say that her performance status is ECOG 0.   Medications:  Current Outpatient Medications:    aspirin EC 81 MG tablet, Take 81 mg by mouth daily. Swallow whole., Disp: , Rfl:    norethindrone (MICRONOR) 0.35 MG tablet, Take 1 tablet (0.35 mg total) by mouth daily., Disp: 84 tablet, Rfl: 3   traMADol (ULTRAM) 50 MG tablet, Take 1 tablet (50 mg total) by mouth every 6 (six) hours as needed for moderate pain or severe pain., Disp: 20 tablet, Rfl: 0  Allergies: No Known Allergies  Past Medical History, Surgical history, Social history, and Family History were reviewed and updated.  Review of Systems: Review of Systems  Constitutional: Negative.   HENT:  Negative.    Eyes: Negative.   Respiratory: Negative.    Cardiovascular: Negative.   Gastrointestinal: Negative.   Endocrine: Negative.   Genitourinary: Negative.    Musculoskeletal: Negative.   Skin: Negative.   Neurological: Negative.   Hematological: Negative.   Psychiatric/Behavioral: Negative.       Physical Exam:  height is 5\' 2"  (1.575 m) and weight is 136 lb (61.7 kg). Her oral temperature is 99.2 F (37.3 C). Her blood pressure is 122/87 and her pulse is 79. Her respiration is 16 and oxygen saturation is 99%.   Wt Readings from Last 3 Encounters:  03/30/23 136 lb (61.7 kg)  12/01/22 135 lb 1.9 oz (61.3 kg)  09/01/22 134 lb 1.9 oz (60.8 kg)    Physical Exam Vitals reviewed.  HENT:     Head: Normocephalic and atraumatic.  Eyes:     Pupils: Pupils are equal, round, and reactive to light.  Cardiovascular:     Rate and Rhythm: Normal rate and regular rhythm.     Heart sounds: Normal heart sounds.  Pulmonary:     Effort: Pulmonary effort is normal.     Breath sounds: Normal breath sounds.  Abdominal:     General: Bowel sounds are normal.     Palpations: Abdomen is soft.  Musculoskeletal:        General: No tenderness or deformity. Normal range of motion.     Cervical back: Normal range of motion.  Lymphadenopathy:     Cervical: No cervical adenopathy.  Skin:    General: Skin is warm and dry.     Findings: No erythema or rash.  Neurological:     Mental Status:  She is alert and oriented to person, place, and time.  Psychiatric:        Behavior: Behavior normal.        Thought Content: Thought content normal.        Judgment: Judgment normal.     Lab Results  Component Value Date   WBC 9.3 03/30/2023   HGB 16.2 (H) 03/30/2023   HCT 46.6 (H) 03/30/2023   MCV 84.1 03/30/2023   PLT 656 (H) 03/30/2023     Chemistry      Component Value Date/Time   NA 137 03/30/2023 1506   K 4.1 03/30/2023 1506   CL 102 03/30/2023 1506   CO2 28 03/30/2023 1506   BUN 14 03/30/2023 1506   CREATININE 0.71 03/30/2023 1506   CREATININE 0.71 04/18/2020 1620      Component Value Date/Time   CALCIUM 9.4 03/30/2023 1506   ALKPHOS 57 03/30/2023 1506   AST 15 03/30/2023 1506   ALT 18 03/30/2023 1506   BILITOT 0.6 03/30/2023 1506       Impression and Plan: Lori [name] is a  very charming 28 year old female.  She has essential thrombocythemia.  She is JAK2 positive.  She has low risk disease.   Her blood counts are absolutely rock steady.  I am so happy for her.  She is doing well.  She and her husband still are talking about starting a family.  She is not sure when this will happen but will let us know.  Her last ultrasound showed that her spleen was slightly smaller.  I would like to do another ultrasound on her in May so we can continue to follow-up.  I would like to see her back in about 4 months.  This way, we will get her through most of the Spring and get her back before Summer.   Josph Macho, MD 2/26/20253:49 PMI

## 2023-03-31 LAB — IRON AND IRON BINDING CAPACITY (CC-WL,HP ONLY)
Iron: 69 ug/dL (ref 28–170)
Saturation Ratios: 18 % (ref 10.4–31.8)
TIBC: 389 ug/dL (ref 250–450)
UIBC: 320 ug/dL (ref 148–442)

## 2023-04-12 ENCOUNTER — Other Ambulatory Visit: Payer: Self-pay | Admitting: *Deleted

## 2023-04-12 DIAGNOSIS — Z3041 Encounter for surveillance of contraceptive pills: Secondary | ICD-10-CM

## 2023-04-12 NOTE — Telephone Encounter (Signed)
 Med refill request:norethindrone 0.35 mg PO daily Last AEX: 05/26/22 -BS Next AEX: 06/01/23 -JC Last MMG (if hormonal med) N/A Refill authorized: Please Advise?

## 2023-04-13 ENCOUNTER — Other Ambulatory Visit (HOSPITAL_COMMUNITY): Payer: Self-pay

## 2023-04-13 MED ORDER — NORETHINDRONE 0.35 MG PO TABS
1.0000 | ORAL_TABLET | Freq: Every day | ORAL | 0 refills | Status: DC
  Filled 2023-04-13 – 2023-05-17 (×2): qty 84, 84d supply, fill #0

## 2023-05-17 ENCOUNTER — Other Ambulatory Visit (HOSPITAL_COMMUNITY): Payer: Self-pay

## 2023-06-01 ENCOUNTER — Other Ambulatory Visit (HOSPITAL_COMMUNITY): Payer: Self-pay

## 2023-06-01 ENCOUNTER — Encounter: Payer: Self-pay | Admitting: Radiology

## 2023-06-01 ENCOUNTER — Ambulatory Visit (INDEPENDENT_AMBULATORY_CARE_PROVIDER_SITE_OTHER): Admitting: Radiology

## 2023-06-01 ENCOUNTER — Other Ambulatory Visit (HOSPITAL_COMMUNITY)
Admission: RE | Admit: 2023-06-01 | Discharge: 2023-06-01 | Disposition: A | Discharge status: Home / Self Care | Source: Ambulatory Visit | Attending: Radiology | Admitting: Radiology

## 2023-06-01 VITALS — BP 118/70 | HR 90 | Ht 63.0 in | Wt 135.2 lb

## 2023-06-01 DIAGNOSIS — Z1331 Encounter for screening for depression: Secondary | ICD-10-CM

## 2023-06-01 DIAGNOSIS — Z01419 Encounter for gynecological examination (general) (routine) without abnormal findings: Secondary | ICD-10-CM | POA: Diagnosis not present

## 2023-06-01 DIAGNOSIS — Z3041 Encounter for surveillance of contraceptive pills: Secondary | ICD-10-CM | POA: Diagnosis not present

## 2023-06-01 MED ORDER — NORETHINDRONE 0.35 MG PO TABS
1.0000 | ORAL_TABLET | Freq: Every day | ORAL | 4 refills | Status: DC
  Filled 2023-06-01 – 2023-08-10 (×2): qty 84, 84d supply, fill #0

## 2023-06-01 NOTE — Progress Notes (Signed)
 Lori [name] [birth year] [id number]   History:  28 y.o. G0 presents for annual exam. Has occasional spotting on POPs, otherwise happy with method. Fibroadenoma removed right breast 2024, no new areas of concerns. Does report the tissue feels more dense in the area of removal. Had MFM consult last year, hx of essential thrombocytopenia. No plans for pregnancy in the next year.  Gynecologic History No LMP recorded. Period Cycle (Days):  (irregular periods with POP) Contraception/Family planning: oral progesterone-only contraceptive Sexually active: yes Last Pap: 2022. Results were: normal   Obstetric History OB History  Gravida Para Term Preterm AB Living  0 0 0 0 0 0  SAB IAB Ectopic Multiple Live Births  0 0 0 0        06/01/2023    1:22 PM  Depression screen PHQ 2/9  Decreased Interest 0  Down, Depressed, Hopeless 0  PHQ - 2 Score 0     The following portions of the patient's history were reviewed and updated as appropriate: allergies, current medications, past family history, past medical history, past social history, past surgical history, and problem list.  Review of Systems  All other systems reviewed and are negative.   Past medical history, past surgical history, family history and social history were all reviewed and documented in the EPIC chart.  Exam:  Vitals:   06/01/23 1320  BP: 118/70  Pulse: 90  SpO2: 99%  Weight: 135 lb 3.2 oz (61.3 kg)  Height: 5\' 3"  (1.6 m)   Body mass index is 23.95 kg/m.  Physical Exam Vitals and nursing note reviewed. Exam conducted with a chaperone present.  Constitutional:      Appearance: Normal appearance. She is normal weight.  HENT:     Head: Normocephalic and atraumatic.  Neck:     Thyroid: No thyroid mass, thyromegaly or thyroid tenderness.  Cardiovascular:     Rate and Rhythm: Regular rhythm.     Heart sounds: Normal heart sounds.  Pulmonary:     Effort: Pulmonary effort is normal.     Breath sounds: Normal  breath sounds.  Chest:  Breasts:    Breasts are symmetrical.     Right: Normal. No inverted nipple, mass, nipple discharge, skin change or tenderness.     Left: Normal. No inverted nipple, mass, nipple discharge, skin change or tenderness.     Comments: Surgical scar right areola Abdominal:     General: Abdomen is flat. Bowel sounds are normal.     Palpations: Abdomen is soft.  Genitourinary:    General: Normal vulva.     Vagina: Normal. No vaginal discharge, bleeding or lesions.     Cervix: Normal. No discharge or lesion.     Uterus: Normal. Not enlarged and not tender.      Adnexa: Right adnexa normal and left adnexa normal.       Right: No mass, tenderness or fullness.         Left: No mass, tenderness or fullness.    Lymphadenopathy:     Upper Body:     Right upper body: No axillary adenopathy.     Left upper body: No axillary adenopathy.  Skin:    General: Skin is warm and dry.  Neurological:     Mental Status: She is alert and oriented to person, place, and time.  Psychiatric:        Mood and Affect: Mood normal.        Thought Content: Thought content normal.  Judgment: Judgment normal.      Ellis Guys, CMA present for exam  Assessment/Plan:   1. Well woman exam with routine gynecological exam (Primary) - Cytology - PAP( )  2. Surveillance of contraceptive pill Reassured spotting can be normal. If it worsens can switch to Slynd, IUD or Nexplanon  - norethindrone  (MICRONOR ) 0.35 MG tablet; Take 1 tablet (0.35 mg total) by mouth daily.  Dispense: 84 tablet; Refill: 4     Return in about 1 year (around 05/31/2024) for Annual.  Synetta Eves B WHNP-BC 1:34 PM 06/01/2023

## 2023-06-03 LAB — CYTOLOGY - PAP: Diagnosis: NEGATIVE

## 2023-06-29 ENCOUNTER — Ambulatory Visit: Payer: Self-pay | Admitting: Hematology & Oncology

## 2023-06-29 ENCOUNTER — Ambulatory Visit (HOSPITAL_COMMUNITY)
Admission: RE | Admit: 2023-06-29 | Discharge: 2023-06-29 | Disposition: A | Discharge status: Home / Self Care | Source: Ambulatory Visit | Attending: Hematology & Oncology | Admitting: Hematology & Oncology

## 2023-06-29 DIAGNOSIS — D473 Essential (hemorrhagic) thrombocythemia: Secondary | ICD-10-CM | POA: Diagnosis not present

## 2023-06-29 DIAGNOSIS — R161 Splenomegaly, not elsewhere classified: Secondary | ICD-10-CM | POA: Diagnosis not present

## 2023-07-06 ENCOUNTER — Inpatient Hospital Stay: Payer: Commercial Managed Care - PPO | Attending: Hematology & Oncology

## 2023-07-06 ENCOUNTER — Other Ambulatory Visit: Payer: Self-pay

## 2023-07-06 ENCOUNTER — Encounter: Payer: Self-pay | Admitting: Hematology & Oncology

## 2023-07-06 ENCOUNTER — Inpatient Hospital Stay (HOSPITAL_BASED_OUTPATIENT_CLINIC_OR_DEPARTMENT_OTHER): Payer: Commercial Managed Care - PPO | Admitting: Hematology & Oncology

## 2023-07-06 VITALS — BP 120/81 | HR 83 | Temp 98.6°F | Resp 18 | Ht 62.0 in | Wt 134.0 lb

## 2023-07-06 DIAGNOSIS — D473 Essential (hemorrhagic) thrombocythemia: Secondary | ICD-10-CM | POA: Diagnosis not present

## 2023-07-06 DIAGNOSIS — D75839 Thrombocytosis, unspecified: Secondary | ICD-10-CM | POA: Insufficient documentation

## 2023-07-06 DIAGNOSIS — Z7982 Long term (current) use of aspirin: Secondary | ICD-10-CM | POA: Diagnosis not present

## 2023-07-06 DIAGNOSIS — Z79899 Other long term (current) drug therapy: Secondary | ICD-10-CM | POA: Insufficient documentation

## 2023-07-06 LAB — CMP (CANCER CENTER ONLY)
ALT: 15 U/L (ref 0–44)
AST: 16 U/L (ref 15–41)
Albumin: 4.8 g/dL (ref 3.5–5.0)
Alkaline Phosphatase: 65 U/L (ref 38–126)
Anion gap: 8 (ref 5–15)
BUN: 14 mg/dL (ref 6–20)
CO2: 28 mmol/L (ref 22–32)
Calcium: 9.4 mg/dL (ref 8.9–10.3)
Chloride: 103 mmol/L (ref 98–111)
Creatinine: 0.78 mg/dL (ref 0.44–1.00)
GFR, Estimated: >60 mL/min (ref >60)
Glucose, Bld: 97 mg/dL (ref 70–99)
Potassium: 3.9 mmol/L (ref 3.5–5.1)
Sodium: 139 mmol/L (ref 135–145)
Total Bilirubin: 0.6 mg/dL (ref 0.0–1.2)
Total Protein: 7 g/dL (ref 6.5–8.1)

## 2023-07-06 LAB — FERRITIN: Ferritin: 148 ng/mL (ref 11–307)

## 2023-07-06 LAB — CBC WITH DIFFERENTIAL (CANCER CENTER ONLY)
Abs Immature Granulocytes: 0.03 10*3/uL (ref 0.00–0.07)
Basophils Absolute: 0.1 10*3/uL (ref 0.0–0.1)
Basophils Relative: 1 %
Eosinophils Absolute: 0.4 10*3/uL (ref 0.0–0.5)
Eosinophils Relative: 5 %
HCT: 45.5 % (ref 36.0–46.0)
Hemoglobin: 15.6 g/dL — ABNORMAL HIGH (ref 12.0–15.0)
Immature Granulocytes: 0 %
Lymphocytes Relative: 23 %
Lymphs Abs: 2.1 10*3/uL (ref 0.7–4.0)
MCH: 29.1 pg (ref 26.0–34.0)
MCHC: 34.3 g/dL (ref 30.0–36.0)
MCV: 84.7 fL (ref 80.0–100.0)
Monocytes Absolute: 0.6 10*3/uL (ref 0.1–1.0)
Monocytes Relative: 7 %
Neutro Abs: 5.6 10*3/uL (ref 1.7–7.7)
Neutrophils Relative %: 64 %
Platelet Count: 646 10*3/uL — ABNORMAL HIGH (ref 150–400)
RBC: 5.37 MIL/uL — ABNORMAL HIGH (ref 3.87–5.11)
RDW: 13.2 % (ref 11.5–15.5)
WBC Count: 8.9 10*3/uL (ref 4.0–10.5)
nRBC: 0 % (ref 0.0–0.2)

## 2023-07-06 LAB — LACTATE DEHYDROGENASE: LDH: 235 U/L — ABNORMAL HIGH (ref 98–192)

## 2023-07-06 LAB — SAVE SMEAR(SSMR), FOR PROVIDER SLIDE REVIEW

## 2023-07-06 MED ORDER — RUXOLITINIB PHOSPHATE 10 MG PO TABS: 10.0000 mg | ORAL_TABLET | Freq: Two times a day (BID) | ORAL | 6 refills | Status: DC

## 2023-07-06 NOTE — Progress Notes (Signed)
 Hematology and Oncology Follow Up Visit  Hennessey Cantrell [id number] [birth year] 28 y.o. 07/06/2023   Principle Diagnosis:  Essential thrombocythemia --JAK2 positive  Current Therapy:   Aspirin 81 mg p.o. daily Jakifi 10 mg po BID -- start on 07/12/2023     Interim History:  Ms. Hayse is back for follow-up.  I am a little bit troubled by the fact that her spleen is increasing in size.  We did her ultrasound, her spleen was 513 cm.  When we last checked her spleen, it was 340 cm.  This really troubles me.  I had to believe this is reflective of the activity of her bone marrow.  I realize that her platelet count is holding steady.  However, I really think that we are at a point where we are going to have to get her on treatment I think for the essential thrombocythemia.  I think that Jakafi would be very reasonable.  I think Jakafi at 10 mg p.o. twice daily would be a good choice for her.  Had a long talk with her.  I realize that she is quite young.  I know that she and her husband are going and will want to start a family at some point.  We clearly will have to get her off Jakafi if and when that time comes.  She is still working.  I would not think that the [facility] would cause problems with her working.  She has had no problems with swollen lymph nodes.  She has had no rashes.  There has been no cough or shortness of breath.  She has had no nausea or vomiting.  She has had no change in bowel or bladder habits.  She still has her monthly cycles.  When we last saw her, her ferritin was 125 with an iron saturation of 18%.  Overall, I would have said that her performance status is ECOG 0.   Medications:  Current Outpatient Medications:    aspirin EC 81 MG tablet, Take 81 mg by mouth daily. Swallow whole., Disp: , Rfl:    norethindrone  (MICRONOR ) 0.35 MG tablet, Take 1 tablet (0.35 mg total) by mouth daily., Disp: 84 tablet, Rfl: 4   traMADol  (ULTRAM ) 50 MG tablet, Take 1 tablet (50 mg  total) by mouth every 6 (six) hours as needed for moderate pain or severe pain. (Patient not taking: Reported on 06/01/2023), Disp: 20 tablet, Rfl: 0  Allergies: No Known Allergies  Past Medical History, Surgical history, Social history, and Family History were reviewed and updated.  Review of Systems: Review of Systems  Constitutional: Negative.   HENT:  Negative.    Eyes: Negative.   Respiratory: Negative.    Cardiovascular: Negative.   Gastrointestinal: Negative.   Endocrine: Negative.   Genitourinary: Negative.    Musculoskeletal: Negative.   Skin: Negative.   Neurological: Negative.   Hematological: Negative.   Psychiatric/Behavioral: Negative.      Physical Exam:  height is 5\' 2"  (1.575 m) and weight is 134 lb (60.8 kg). Her oral temperature is 98.6 F (37 C). Her blood pressure is 120/81 and her pulse is 83. Her respiration is 18 and oxygen saturation is 99%.   Wt Readings from Last 3 Encounters:  07/06/23 134 lb (60.8 kg)  06/01/23 135 lb 3.2 oz (61.3 kg)  03/30/23 136 lb (61.7 kg)    Physical Exam Vitals reviewed.  HENT:     Head: Normocephalic and atraumatic.  Eyes:     Pupils: Pupils are equal,  round, and reactive to light.  Cardiovascular:     Rate and Rhythm: Normal rate and regular rhythm.     Heart sounds: Normal heart sounds.  Pulmonary:     Effort: Pulmonary effort is normal.     Breath sounds: Normal breath sounds.  Abdominal:     General: Bowel sounds are normal.     Palpations: Abdomen is soft.     Comments: Her spleen tip is palpable with inspiration under the left costal margin.  Musculoskeletal:        General: No tenderness or deformity. Normal range of motion.     Cervical back: Normal range of motion.  Lymphadenopathy:     Cervical: No cervical adenopathy.  Skin:    General: Skin is warm and dry.     Findings: No erythema or rash.  Neurological:     Mental Status: She is alert and oriented to person, place, and time.  Psychiatric:         Behavior: Behavior normal.        Thought Content: Thought content normal.        Judgment: Judgment normal.      Lab Results  Component Value Date   WBC 8.9 07/06/2023   HGB 15.6 (H) 07/06/2023   HCT 45.5 07/06/2023   MCV 84.7 07/06/2023   PLT 646 (H) 07/06/2023     Chemistry      Component Value Date/Time   NA 137 03/30/2023 1506   K 4.1 03/30/2023 1506   CL 102 03/30/2023 1506   CO2 28 03/30/2023 1506   BUN 14 03/30/2023 1506   CREATININE 0.71 03/30/2023 1506   CREATININE 0.71 04/18/2020 1620      Component Value Date/Time   CALCIUM 9.4 03/30/2023 1506   ALKPHOS 57 03/30/2023 1506   AST 15 03/30/2023 1506   ALT 18 03/30/2023 1506   BILITOT 0.6 03/30/2023 1506       Impression and Plan: Ms. Dlugosz is a very charming 28 year old female.  She has essential thrombocythemia.  She is JAK2 positive.  She has low risk disease.   Again, I just worried about the splenomegaly.  Her spleen is increasing in size.  Again I have to believe this is reflective of the activity of her bone marrow.  I do think that Jakafi is a reasonable choice for her.  I think that she would be able to tolerate this.  I think it is relatively low-dose.  I would like to think that we should be able to see her platelet count coming down nicely.  In addition, her spleen also should decrease in size.  I would like to have her come back to see us  probably in about 6 weeks or so.  I know that she and her husband will be going to the beach sometime in mid July.  We will plan to get her back afterwards.   Ivor Mars, MD 6/4/20253:21 PMI

## 2023-07-07 ENCOUNTER — Other Ambulatory Visit (HOSPITAL_COMMUNITY): Payer: Self-pay

## 2023-07-07 ENCOUNTER — Telehealth: Payer: Self-pay | Admitting: Pharmacy Technician

## 2023-07-07 ENCOUNTER — Encounter: Payer: Self-pay | Admitting: Pharmacist

## 2023-07-07 ENCOUNTER — Telehealth: Payer: Self-pay

## 2023-07-07 LAB — IRON AND IRON BINDING CAPACITY (CC-WL,HP ONLY)
Iron: 80 ug/dL (ref 28–170)
Saturation Ratios: 21 % (ref 10.4–31.8)
TIBC: 382 ug/dL (ref 250–450)
UIBC: 302 ug/dL (ref 148–442)

## 2023-07-07 NOTE — Telephone Encounter (Signed)
 error

## 2023-07-07 NOTE — Telephone Encounter (Signed)
 Oral Oncology Patient Advocate Encounter   Received notification that prior authorization for Jakafi is required.   PA submitted on 07/07/2023 Key [id number] Status is pending     Lori [name] (Patty) Benjaman Branch, CPhT  [facility], [place], [place], [place] Oral Chemotherapy Patient Advocate Phone: [phone]  Fax: [phone]

## 2023-07-08 NOTE — Telephone Encounter (Signed)
 Encounter opened in error.

## 2023-07-08 NOTE — Telephone Encounter (Signed)
 Oral Oncology Patient Advocate Encounter  Received notification that the request for prior authorization for Cosmo Dirk has been denied due to "Our guideline named RUXOLITINIB (reviewed for Jakafi 10mg  tablet) requires that you have ONE of the following diagnoses: 1. Intermediate or high-risk myelofibrosis, which includes primary myelofibrosis, post-polycythemia vera myelofibrosis, or post-essential thrombocythemia myelofibrosis (types of blood cancer). 2. Polycythemia vera (a type of blood cancer). 3. Steroid-refractory Acute graft-versus-host disease (a type of short-term immune disorder that did not respond to a type of treatment). 4. Chronic graft-versus-host disease (a type of long-term immune disorder).Please note that for patients who have one of the conditions listed above, additional guideline requirements must also be met before Jakafi will be covered under the pharmacy benefit. Your doctor requested this medication for essential thrombocythemia (a type of blood cancer), but it is not clinically supported for this use. Because of this, additional requirements must be met for approval."     Nylee Barbuto (Patty) Benjaman Branch, CPhT  [facility], [place], [place], [place] Oral Chemotherapy Patient Advocate Phone: [phone]  Fax: [phone]

## 2023-07-08 NOTE — Telephone Encounter (Signed)
 Staff messaged Dr. Kaprice Shiner on 07/08/23 to let him know about the denial.

## 2023-07-12 ENCOUNTER — Other Ambulatory Visit: Payer: Self-pay

## 2023-07-12 ENCOUNTER — Other Ambulatory Visit (HOSPITAL_COMMUNITY): Payer: Self-pay

## 2023-07-20 ENCOUNTER — Telehealth: Payer: Self-pay

## 2023-07-20 NOTE — Telephone Encounter (Signed)
 Received phone call from patient inquiring about the medication (Jakafi ) approval. Pt educated that Dr. Bryla Shiner is still working on getting it approved. Pt educated at this time there is no guaranteed time-line for approval but to continue to call. Pt verbalized understanding and had no further questions.

## 2023-08-11 ENCOUNTER — Other Ambulatory Visit (HOSPITAL_COMMUNITY): Payer: Self-pay

## 2023-08-11 ENCOUNTER — Other Ambulatory Visit: Payer: Self-pay | Admitting: Hematology & Oncology

## 2023-08-11 ENCOUNTER — Other Ambulatory Visit: Payer: Self-pay

## 2023-08-11 ENCOUNTER — Telehealth: Payer: Self-pay

## 2023-08-11 MED ORDER — HYDROXYUREA 500 MG PO CAPS
500.0000 mg | ORAL_CAPSULE | Freq: Every day | ORAL | 5 refills | Status: DC
  Filled 2023-08-11: qty 30, 30d supply, fill #0

## 2023-08-11 NOTE — Telephone Encounter (Signed)
 Patient called and informed that her insurance would not approve the Jakafi .  Pt aware that Dr. Timmy sent in a prescription for hydroxyurea  and she is to take it daily with food. Pt verbalized understanding and had no further questions. Pt appreciative of call.

## 2023-08-24 ENCOUNTER — Ambulatory Visit: Admitting: Hematology & Oncology

## 2023-08-24 ENCOUNTER — Other Ambulatory Visit

## 2023-09-14 ENCOUNTER — Inpatient Hospital Stay: Admitting: Hematology & Oncology

## 2023-09-14 ENCOUNTER — Other Ambulatory Visit (HOSPITAL_COMMUNITY): Payer: Self-pay

## 2023-09-14 ENCOUNTER — Encounter: Payer: Self-pay | Admitting: Hematology & Oncology

## 2023-09-14 ENCOUNTER — Inpatient Hospital Stay: Attending: Hematology & Oncology

## 2023-09-14 VITALS — BP 113/87 | HR 85 | Temp 98.5°F | Resp 18 | Ht 63.0 in | Wt 136.0 lb

## 2023-09-14 DIAGNOSIS — D473 Essential (hemorrhagic) thrombocythemia: Secondary | ICD-10-CM | POA: Diagnosis not present

## 2023-09-14 DIAGNOSIS — Z7982 Long term (current) use of aspirin: Secondary | ICD-10-CM | POA: Insufficient documentation

## 2023-09-14 DIAGNOSIS — Z79899 Other long term (current) drug therapy: Secondary | ICD-10-CM | POA: Insufficient documentation

## 2023-09-14 LAB — CBC WITH DIFFERENTIAL (CANCER CENTER ONLY)
Abs Immature Granulocytes: 0.01 K/uL (ref 0.00–0.07)
Basophils Absolute: 0.1 K/uL (ref 0.0–0.1)
Basophils Relative: 1 %
Eosinophils Absolute: 0.4 K/uL (ref 0.0–0.5)
Eosinophils Relative: 5 %
HCT: 45.1 % (ref 36.0–46.0)
Hemoglobin: 15.5 g/dL — ABNORMAL HIGH (ref 12.0–15.0)
Immature Granulocytes: 0 %
Lymphocytes Relative: 24 %
Lymphs Abs: 1.7 K/uL (ref 0.7–4.0)
MCH: 29.9 pg (ref 26.0–34.0)
MCHC: 34.4 g/dL (ref 30.0–36.0)
MCV: 87.1 fL (ref 80.0–100.0)
Monocytes Absolute: 0.5 K/uL (ref 0.1–1.0)
Monocytes Relative: 7 %
Neutro Abs: 4.3 K/uL (ref 1.7–7.7)
Neutrophils Relative %: 63 %
Platelet Count: 501 K/uL — ABNORMAL HIGH (ref 150–400)
RBC: 5.18 MIL/uL — ABNORMAL HIGH (ref 3.87–5.11)
RDW: 15.1 % (ref 11.5–15.5)
WBC Count: 6.9 K/uL (ref 4.0–10.5)
nRBC: 0 % (ref 0.0–0.2)

## 2023-09-14 LAB — FERRITIN: Ferritin: 237 ng/mL (ref 11–307)

## 2023-09-14 LAB — CMP (CANCER CENTER ONLY)
ALT: 15 U/L (ref 0–44)
AST: 19 U/L (ref 15–41)
Albumin: 4.5 g/dL (ref 3.5–5.0)
Alkaline Phosphatase: 71 U/L (ref 38–126)
Anion gap: 10 (ref 5–15)
BUN: 15 mg/dL (ref 6–20)
CO2: 25 mmol/L (ref 22–32)
Calcium: 9.3 mg/dL (ref 8.9–10.3)
Chloride: 103 mmol/L (ref 98–111)
Creatinine: 0.8 mg/dL (ref 0.44–1.00)
GFR, Estimated: >60 mL/min (ref >60)
Glucose, Bld: 100 mg/dL — ABNORMAL HIGH (ref 70–99)
Potassium: 4 mmol/L (ref 3.5–5.1)
Sodium: 138 mmol/L (ref 135–145)
Total Bilirubin: 0.5 mg/dL (ref 0.0–1.2)
Total Protein: 6.7 g/dL (ref 6.5–8.1)

## 2023-09-14 LAB — IRON AND IRON BINDING CAPACITY (CC-WL,HP ONLY)
Iron: 68 ug/dL (ref 28–170)
Saturation Ratios: 18 % (ref 10.4–31.8)
TIBC: 379 ug/dL (ref 250–450)
UIBC: 311 ug/dL

## 2023-09-14 LAB — LACTATE DEHYDROGENASE: LDH: 215 U/L — ABNORMAL HIGH (ref 98–192)

## 2023-09-14 LAB — SAVE SMEAR(SSMR), FOR PROVIDER SLIDE REVIEW

## 2023-09-14 MED ORDER — CIPROFLOXACIN HCL 500 MG PO TABS
500.0000 mg | ORAL_TABLET | Freq: Two times a day (BID) | ORAL | 0 refills | Status: DC
  Filled 2023-09-14: qty 14, 7d supply, fill #0

## 2023-09-14 MED ORDER — HYDROXYUREA 500 MG PO CAPS
500.0000 mg | ORAL_CAPSULE | Freq: Every day | ORAL | 5 refills | Status: AC
  Filled 2023-09-14 (×2): qty 30, 30d supply, fill #0
  Filled 2023-10-17: qty 30, 30d supply, fill #1
  Filled 2023-11-18: qty 30, 30d supply, fill #2
  Filled 2023-12-22: qty 30, 30d supply, fill #3
  Filled 2024-01-24: qty 30, 30d supply, fill #4
  Filled 2024-02-28: qty 30, 30d supply, fill #5

## 2023-09-14 NOTE — Progress Notes (Signed)
 Hematology and Oncology Follow Up Visit  Britne Borelli [id number] 31-May-1995 28 y.o. 09/14/2023   Principle Diagnosis:  Essential thrombocythemia --JAK2 positive  Current Therapy:   Aspirin 81 mg p.o. daily Hydrea  500 mg p.o. daily*08/11/2023      Interim History:  Ms. Staunton is back for follow-up.  She is now on Hydrea .  She is doing well on the Hydrea .  I really want to get her on Jakafi  but her insurance would not cover this.  She has had a nice summer.  She is very busy at work.  She has had no problems with nausea or vomiting.  She has had no bleeding.  There have been no change in bowel or bladder habits.  She still has monthly cycles.  There is been no leg swelling.  She has had no rashes.  The big news is that she and her husband are heading down to [place] to see his family on Saturday.  They will be down there for a week.   I will call in some ciprofloxacin  for just in case she develops travelers diarrhea.  I told her to make sure she takes Pepto-Bismol while she is down there.  She has had no fever.  She has had no cough.  She is exercising.  Overall, I would say that her performance status is ECOG 0.   Medications:  Current Outpatient Medications:    aspirin EC 81 MG tablet, Take 81 mg by mouth daily. Swallow whole., Disp: , Rfl:    hydroxyurea  (HYDREA ) 500 MG capsule, Take 1 capsule (500 mg total) by mouth daily. May take with food to minimize GI side effects., Disp: 30 capsule, Rfl: 5   norethindrone  (MICRONOR ) 0.35 MG tablet, Take 1 tablet (0.35 mg total) by mouth daily., Disp: 84 tablet, Rfl: 4   traMADol  (ULTRAM ) 50 MG tablet, Take 1 tablet (50 mg total) by mouth every 6 (six) hours as needed for moderate pain or severe pain., Disp: 20 tablet, Rfl: 0  Allergies: No Known Allergies  Past Medical History, Surgical history, Social history, and Family History were reviewed and updated.  Review of Systems: Review of Systems  Constitutional: Negative.   HENT:  Negative.     Eyes: Negative.   Respiratory: Negative.    Cardiovascular: Negative.   Gastrointestinal: Negative.   Endocrine: Negative.   Genitourinary: Negative.    Musculoskeletal: Negative.   Skin: Negative.   Neurological: Negative.   Hematological: Negative.   Psychiatric/Behavioral: Negative.      Physical Exam:  height is 5' 3 (1.6 m) and weight is 136 lb (61.7 kg). Her oral temperature is 98.5 F (36.9 C). Her blood pressure is 113/87 and her pulse is 85. Her respiration is 18 and oxygen saturation is 99%.   Wt Readings from Last 3 Encounters:  09/14/23 136 lb (61.7 kg)  07/06/23 134 lb (60.8 kg)  06/01/23 135 lb 3.2 oz (61.3 kg)    Physical Exam Vitals reviewed.  HENT:     Head: Normocephalic and atraumatic.  Eyes:     Pupils: Pupils are equal, round, and reactive to light.  Cardiovascular:     Rate and Rhythm: Normal rate and regular rhythm.     Heart sounds: Normal heart sounds.  Pulmonary:     Effort: Pulmonary effort is normal.     Breath sounds: Normal breath sounds.  Abdominal:     General: Bowel sounds are normal.     Palpations: Abdomen is soft.     Comments: Her  spleen tip might be palpable with inspiration under the left costal margin.  Musculoskeletal:        General: No tenderness or deformity. Normal range of motion.     Cervical back: Normal range of motion.  Lymphadenopathy:     Cervical: No cervical adenopathy.  Skin:    General: Skin is warm and dry.     Findings: No erythema or rash.  Neurological:     Mental Status: She is alert and oriented to person, place, and time.  Psychiatric:        Behavior: Behavior normal.        Thought Content: Thought content normal.        Judgment: Judgment normal.      Lab Results  Component Value Date   WBC 6.9 09/14/2023   HGB 15.5 (H) 09/14/2023   HCT 45.1 09/14/2023   MCV 87.1 09/14/2023   PLT 501 (H) 09/14/2023     Chemistry      Component Value Date/Time   NA 139 07/06/2023 1456   K 3.9  07/06/2023 1456   CL 103 07/06/2023 1456   CO2 28 07/06/2023 1456   BUN 14 07/06/2023 1456   CREATININE 0.78 07/06/2023 1456   CREATININE 0.71 04/18/2020 1620      Component Value Date/Time   CALCIUM 9.4 07/06/2023 1456   ALKPHOS 65 07/06/2023 1456   AST 16 07/06/2023 1456   ALT 15 07/06/2023 1456   BILITOT 0.6 07/06/2023 1456       Impression and Plan: Ms. Condrey is a very charming 28 year old female.  She has essential thrombocythemia.  She is JAK2 positive.  She has low risk disease.   She currently is on Hydrea  now.  Her platelet count is coming down.  It is hard to tell if her spleen is smaller.  I probably would ultrasound of her spleen when we see her back.  I would like to see her back in October.  I talked to her about getting pregnant.  If she does get pregnant she needs to let us  know so we can get her off the Hydrea  quickly.  I know that she will have a wonderful time down in [place].  She has never been there before.  Again, we will plan to see her back in October.  I will get ultrasound of her spleen the same day that we see her.   Maude JONELLE Crease, MD 8/13/20254:01 PMI

## 2023-09-16 ENCOUNTER — Other Ambulatory Visit (HOSPITAL_COMMUNITY): Payer: Self-pay

## 2023-09-16 MED ORDER — FLUCONAZOLE 150 MG PO TABS
150.0000 mg | ORAL_TABLET | Freq: Once | ORAL | 0 refills | Status: AC
  Filled 2023-09-16: qty 1, 1d supply, fill #0

## 2023-09-26 ENCOUNTER — Ambulatory Visit (HOSPITAL_BASED_OUTPATIENT_CLINIC_OR_DEPARTMENT_OTHER)

## 2023-09-27 ENCOUNTER — Other Ambulatory Visit: Payer: Self-pay | Admitting: Radiology

## 2023-09-27 DIAGNOSIS — Z3041 Encounter for surveillance of contraceptive pills: Secondary | ICD-10-CM

## 2023-09-27 MED ORDER — SLYND 4 MG PO TABS: 1.0000 | ORAL_TABLET | Freq: Every day | ORAL | 2 refills | Status: DC

## 2023-09-27 NOTE — Telephone Encounter (Signed)
 Hi Lori [name],  Absolutely, I am sorry you are having so much bleeding. I sent the prescription for Slynd  to [facility]. They will deliver to your house and get you the lowest price.  Thos Matsumoto

## 2023-10-01 ENCOUNTER — Other Ambulatory Visit (HOSPITAL_BASED_OUTPATIENT_CLINIC_OR_DEPARTMENT_OTHER)

## 2023-10-12 ENCOUNTER — Other Ambulatory Visit (HOSPITAL_BASED_OUTPATIENT_CLINIC_OR_DEPARTMENT_OTHER): Payer: Self-pay

## 2023-10-18 ENCOUNTER — Other Ambulatory Visit (HOSPITAL_COMMUNITY): Payer: Self-pay

## 2023-11-09 ENCOUNTER — Encounter: Payer: Self-pay | Admitting: Hematology & Oncology

## 2023-11-09 ENCOUNTER — Inpatient Hospital Stay: Attending: Hematology & Oncology

## 2023-11-09 ENCOUNTER — Ambulatory Visit (HOSPITAL_BASED_OUTPATIENT_CLINIC_OR_DEPARTMENT_OTHER)
Admission: RE | Admit: 2023-11-09 | Discharge: 2023-11-09 | Disposition: A | Discharge status: Home / Self Care | Source: Ambulatory Visit | Attending: Hematology & Oncology | Admitting: Hematology & Oncology

## 2023-11-09 ENCOUNTER — Inpatient Hospital Stay (HOSPITAL_BASED_OUTPATIENT_CLINIC_OR_DEPARTMENT_OTHER): Admitting: Hematology & Oncology

## 2023-11-09 VITALS — BP 124/86 | HR 77 | Temp 98.8°F | Resp 20 | Ht 63.0 in | Wt 133.8 lb

## 2023-11-09 DIAGNOSIS — Z7982 Long term (current) use of aspirin: Secondary | ICD-10-CM | POA: Insufficient documentation

## 2023-11-09 DIAGNOSIS — D473 Essential (hemorrhagic) thrombocythemia: Secondary | ICD-10-CM | POA: Insufficient documentation

## 2023-11-09 DIAGNOSIS — D696 Thrombocytopenia, unspecified: Secondary | ICD-10-CM | POA: Diagnosis not present

## 2023-11-09 DIAGNOSIS — Z79899 Other long term (current) drug therapy: Secondary | ICD-10-CM | POA: Insufficient documentation

## 2023-11-09 DIAGNOSIS — R161 Splenomegaly, not elsewhere classified: Secondary | ICD-10-CM | POA: Diagnosis not present

## 2023-11-09 LAB — CBC WITH DIFFERENTIAL (CANCER CENTER ONLY)
Abs Immature Granulocytes: 0.02 K/uL (ref 0.00–0.07)
Basophils Absolute: 0.1 K/uL (ref 0.0–0.1)
Basophils Relative: 1 %
Eosinophils Absolute: 0.2 K/uL (ref 0.0–0.5)
Eosinophils Relative: 3 %
HCT: 41.6 % (ref 36.0–46.0)
Hemoglobin: 14.9 g/dL (ref 12.0–15.0)
Immature Granulocytes: 0 %
Lymphocytes Relative: 25 %
Lymphs Abs: 2.2 K/uL (ref 0.7–4.0)
MCH: 32.5 pg (ref 26.0–34.0)
MCHC: 35.8 g/dL (ref 30.0–36.0)
MCV: 90.6 fL (ref 80.0–100.0)
Monocytes Absolute: 0.7 K/uL (ref 0.1–1.0)
Monocytes Relative: 8 %
Neutro Abs: 5.4 K/uL (ref 1.7–7.7)
Neutrophils Relative %: 63 %
Platelet Count: 512 K/uL — ABNORMAL HIGH (ref 150–400)
RBC: 4.59 MIL/uL (ref 3.87–5.11)
RDW: 15.9 % — ABNORMAL HIGH (ref 11.5–15.5)
WBC Count: 8.6 K/uL (ref 4.0–10.5)
nRBC: 0 % (ref 0.0–0.2)

## 2023-11-09 LAB — CMP (CANCER CENTER ONLY)
ALT: 22 U/L (ref 0–44)
AST: 19 U/L (ref 15–41)
Albumin: 4.7 g/dL (ref 3.5–5.0)
Alkaline Phosphatase: 69 U/L (ref 38–126)
Anion gap: 13 (ref 5–15)
BUN: 14 mg/dL (ref 6–20)
CO2: 24 mmol/L (ref 22–32)
Calcium: 9.4 mg/dL (ref 8.9–10.3)
Chloride: 102 mmol/L (ref 98–111)
Creatinine: 0.8 mg/dL (ref 0.44–1.00)
GFR, Estimated: >60 mL/min (ref >60)
Glucose, Bld: 98 mg/dL (ref 70–99)
Potassium: 4.2 mmol/L (ref 3.5–5.1)
Sodium: 140 mmol/L (ref 135–145)
Total Bilirubin: 0.6 mg/dL (ref 0.0–1.2)
Total Protein: 7.1 g/dL (ref 6.5–8.1)

## 2023-11-09 LAB — LACTATE DEHYDROGENASE: LDH: 219 U/L — ABNORMAL HIGH (ref 98–192)

## 2023-11-09 LAB — SAVE SMEAR(SSMR), FOR PROVIDER SLIDE REVIEW

## 2023-11-09 LAB — IRON AND IRON BINDING CAPACITY (CC-WL,HP ONLY)
Iron: 77 ug/dL (ref 28–170)
Saturation Ratios: 22 % (ref 10.4–31.8)
TIBC: 354 ug/dL (ref 250–450)
UIBC: 277 ug/dL

## 2023-11-09 LAB — FERRITIN: Ferritin: 324 ng/mL — ABNORMAL HIGH (ref 11–307)

## 2023-11-09 NOTE — Progress Notes (Signed)
 Hematology and Oncology Follow Up Visit  Lori [name] [id number] 1995/05/15 28 y.o. 11/09/2023   Principle Diagnosis:  Essential thrombocythemia --JAK2 positive  Current Therapy:   Aspirin 81 mg p.o. daily Hydrea  500 mg p.o. daily*08/11/2023      Interim History:  Lori [name] is back for follow-up.  Lori [name] has a comes in with Lori [name] husband.  Has been quite a while since we last saw him.  As always, it is fun talking to Lori [name] and him.  Lori [name] had a good time with his family down in [place].  There were down there for about a week.  I am so glad that they are able to have a good time down there.  They are about to head out to [place], [place]  for a country music concert.  Lori [name] did have a ultrasound of Lori [name] spleen today.  The spleen now measures 411 cm.  Previously, it measured 513 cm.  The Hydrea  is certainly helping..  Lori [name] has had no problems with nausea or vomiting.  There is been no change in bowel or bladder habits.  Lori [name] is still having Lori [name] monthly cycles.  Lori [name] has had no problem with Lori [name] skin.  Currently, I would have to say that Lori [name] performance status is probably ECOG 0.     Medications:  Current Outpatient Medications:    aspirin EC 81 MG tablet, Take 81 mg by mouth daily. Swallow whole., Disp: , Rfl:    Drospirenone  (SLYND ) 4 MG TABS, Take 1 tablet (4 mg total) by mouth daily., Disp: 84 tablet, Rfl: 2   hydroxyurea  (HYDREA ) 500 MG capsule, Take 1 capsule (500 mg total) by mouth daily. May take with food to minimize GI side effects., Disp: 30 capsule, Rfl: 5  Allergies: No Known Allergies  Past Medical History, Surgical history, Social history, and Family History were reviewed and updated.  Review of Systems: Review of Systems  Constitutional: Negative.   HENT:  Negative.    Eyes: Negative.   Respiratory: Negative.    Cardiovascular: Negative.   Gastrointestinal: Negative.   Endocrine: Negative.   Genitourinary: Negative.    Musculoskeletal: Negative.   Skin: Negative.    Neurological: Negative.   Hematological: Negative.   Psychiatric/Behavioral: Negative.      Physical Exam:  height is 5' 3 (1.6 m) and weight is 133 lb 12.8 oz (60.7 kg). Lori [name] oral temperature is 98.8 F (37.1 C). Lori [name] blood pressure is 124/86 and Lori [name] pulse is 77. Lori [name] respiration is 20 and oxygen saturation is 100%.   Wt Readings from Last 3 Encounters:  11/09/23 133 lb 12.8 oz (60.7 kg)  09/14/23 136 lb (61.7 kg)  07/06/23 134 lb (60.8 kg)    Physical Exam Vitals reviewed.  HENT:     Head: Normocephalic and atraumatic.  Eyes:     Pupils: Pupils are equal, round, and reactive to light.  Cardiovascular:     Rate and Rhythm: Normal rate and regular rhythm.     Heart sounds: Normal heart sounds.  Pulmonary:     Effort: Pulmonary effort is normal.     Breath sounds: Normal breath sounds.  Abdominal:     General: Bowel sounds are normal.     Palpations: Abdomen is soft.     Comments: Lori [name] spleen tip might be palpable with inspiration under the left costal margin.  Musculoskeletal:        General: No tenderness or deformity. Normal range of motion.     Cervical back: Normal range of motion.  Lymphadenopathy:  Cervical: No cervical adenopathy.  Skin:    General: Skin is warm and dry.     Findings: No erythema or rash.  Neurological:     Mental Status: Lori [name] is alert and oriented to person, place, and time.  Psychiatric:        Behavior: Behavior normal.        Thought Content: Thought content normal.        Judgment: Judgment normal.      Lab Results  Component Value Date   WBC 8.6 11/09/2023   HGB 14.9 11/09/2023   HCT 41.6 11/09/2023   MCV 90.6 11/09/2023   PLT 512 (H) 11/09/2023     Chemistry      Component Value Date/Time   NA 140 11/09/2023 1455   K 4.2 11/09/2023 1455   CL 102 11/09/2023 1455   CO2 24 11/09/2023 1455   BUN 14 11/09/2023 1455   CREATININE 0.80 11/09/2023 1455   CREATININE 0.71 04/18/2020 1620      Component Value Date/Time   CALCIUM  9.4 11/09/2023 1455   ALKPHOS 69 11/09/2023 1455   AST 19 11/09/2023 1455   ALT 22 11/09/2023 1455   BILITOT 0.6 11/09/2023 1455       Impression and Plan: Lori [name] is a very charming 28 year old female.  Lori [name] has essential thrombocythemia.  Lori [name] is JAK2 positive.  Lori [name] has low risk disease.   Lori [name] currently is on Hydrea  now.  Lori [name] platelet count is coming down.  Lori [name] splenic size is also improving.  For right now, I think we can probably get Lori [name] back in 3 months.  We can get Lori [name] through the Holiday season.  If Lori [name] ever does become pregnant, Lori [name] will let me know so we can get Lori [name] in and figure out how we can help with respect to Lori [name] pregnancy and Lori [name] essential thrombocythemia.  I told Lori [name] and Lori [name] husband to be careful as COVID is making a little bit of a come back and they had to be careful not to get contract this.    Maude JONELLE Crease, MD 10/8/20254:18 PMI

## 2023-11-18 ENCOUNTER — Other Ambulatory Visit: Payer: Self-pay

## 2023-11-28 ENCOUNTER — Other Ambulatory Visit (HOSPITAL_COMMUNITY): Payer: Self-pay

## 2023-11-28 ENCOUNTER — Telehealth: Admitting: Family Medicine

## 2023-11-28 DIAGNOSIS — H10022 Other mucopurulent conjunctivitis, left eye: Secondary | ICD-10-CM | POA: Diagnosis not present

## 2023-11-28 MED ORDER — POLYMYXIN B-TRIMETHOPRIM 10000-0.1 UNIT/ML-% OP SOLN
1.0000 [drp] | Freq: Four times a day (QID) | OPHTHALMIC | 0 refills | Status: DC
  Filled 2023-11-28: qty 10, 30d supply, fill #0

## 2023-11-28 NOTE — Progress Notes (Signed)
 E-Visit for [employer]   We are sorry that you are not feeling well.  Here is how we plan to help!  Based on what you have shared with me it looks like you have conjunctivitis.  Conjunctivitis is a common inflammatory or infectious condition of the eye that is often referred to as pink eye.  In most cases it is contagious (viral or bacterial). However, not all conjunctivitis requires antibiotics (ex. Allergic).  We have made appropriate suggestions for you based upon your presentation.  I have prescribed Polytrim Ophthalmic drops 1-2 drops 4 times a day times 5 days  Pink eye can be highly contagious.  It is typically spread through direct contact with secretions, or contaminated objects or surfaces that one may have touched.  Strict handwashing is suggested with soap and water is urged.  If not available, use alcohol based had sanitizer.  Avoid unnecessary touching of the eye.  If you wear contact lenses, you will need to refrain from wearing them until you see no white discharge from the eye for at least 24 hours after being on medication.  You should see symptom improvement in 1-2 days after starting the medication regimen.  Call us  if symptoms are not improved in 1-2 days.  Home Care: Wash your hands often! Do not wear your contacts until you complete your treatment plan. Avoid sharing towels, bed linen, personal items with a person who has pink eye. See attention for anyone in your home with similar symptoms.  Get Help Right Away If: Your symptoms do not improve. You develop blurred or loss of vision. Your symptoms worsen (increased discharge, pain or redness)   Thank you for choosing an e-visit.  Your e-visit answers were reviewed by a board certified advanced clinical practitioner to complete your personal care plan. Depending upon the condition, your plan could have included both over the counter or prescription medications.  Please review your pharmacy choice. Make sure the  pharmacy is open so you can pick up prescription now. If there is a problem, you may contact your provider through [employer] and have the prescription routed to another pharmacy.  Your safety is important to us . If you have drug allergies check your prescription carefully.   For the next 24 hours you can use MyChart to ask questions about today's visit, request a non-urgent call back, or ask for a work or school excuse. You will get an email in the next two days asking about your experience. I hope that your e-visit has been valuable and will speed your recovery.  I have spent 5 minutes in review of e-visit questionnaire, review and updating patient chart, medical decision making and response to patient.   Chiquita CHRISTELLA Barefoot, NP

## 2024-02-08 ENCOUNTER — Encounter: Payer: Self-pay | Admitting: Hematology & Oncology

## 2024-02-08 ENCOUNTER — Inpatient Hospital Stay: Attending: Hematology & Oncology

## 2024-02-08 ENCOUNTER — Inpatient Hospital Stay: Admitting: Hematology & Oncology

## 2024-02-08 VITALS — BP 121/82 | HR 91 | Temp 99.2°F | Resp 20 | Ht 63.0 in | Wt 133.7 lb

## 2024-02-08 DIAGNOSIS — D473 Essential (hemorrhagic) thrombocythemia: Secondary | ICD-10-CM | POA: Diagnosis not present

## 2024-02-08 LAB — CMP (CANCER CENTER ONLY)
ALT: 18 U/L (ref 0–44)
AST: 21 U/L (ref 15–41)
Albumin: 4.7 g/dL (ref 3.5–5.0)
Alkaline Phosphatase: 68 U/L (ref 38–126)
Anion gap: 12 (ref 5–15)
BUN: 14 mg/dL (ref 6–20)
CO2: 25 mmol/L (ref 22–32)
Calcium: 9.3 mg/dL (ref 8.9–10.3)
Chloride: 103 mmol/L (ref 98–111)
Creatinine: 0.65 mg/dL (ref 0.44–1.00)
GFR, Estimated: >60 mL/min
Glucose, Bld: 109 mg/dL — ABNORMAL HIGH (ref 70–99)
Potassium: 3.7 mmol/L (ref 3.5–5.1)
Sodium: 139 mmol/L (ref 135–145)
Total Bilirubin: 0.6 mg/dL (ref 0.0–1.2)
Total Protein: 6.8 g/dL (ref 6.5–8.1)

## 2024-02-08 LAB — CBC WITH DIFFERENTIAL (CANCER CENTER ONLY)
Abs Immature Granulocytes: 0.03 K/uL (ref 0.00–0.07)
Basophils Absolute: 0.1 K/uL (ref 0.0–0.1)
Basophils Relative: 1 %
Eosinophils Absolute: 0.2 K/uL (ref 0.0–0.5)
Eosinophils Relative: 3 %
HCT: 42.6 % (ref 36.0–46.0)
Hemoglobin: 15.1 g/dL — ABNORMAL HIGH (ref 12.0–15.0)
Immature Granulocytes: 0 %
Lymphocytes Relative: 22 %
Lymphs Abs: 1.7 K/uL (ref 0.7–4.0)
MCH: 33.3 pg (ref 26.0–34.0)
MCHC: 35.4 g/dL (ref 30.0–36.0)
MCV: 94 fL (ref 80.0–100.0)
Monocytes Absolute: 0.5 K/uL (ref 0.1–1.0)
Monocytes Relative: 7 %
Neutro Abs: 5.5 K/uL (ref 1.7–7.7)
Neutrophils Relative %: 67 %
Platelet Count: 536 K/uL — ABNORMAL HIGH (ref 150–400)
RBC: 4.53 MIL/uL (ref 3.87–5.11)
RDW: 12.1 % (ref 11.5–15.5)
WBC Count: 8 K/uL (ref 4.0–10.5)
nRBC: 0 % (ref 0.0–0.2)

## 2024-02-08 LAB — SAVE SMEAR(SSMR), FOR PROVIDER SLIDE REVIEW

## 2024-02-08 LAB — LACTATE DEHYDROGENASE: LDH: 290 U/L — ABNORMAL HIGH (ref 105–235)

## 2024-02-08 NOTE — Progress Notes (Signed)
 " Hematology and Oncology Follow Up Visit  Lori [name] [id number] 1995/10/04 28 y.o. 02/08/2024   Principle Diagnosis:  Essential thrombocythemia --JAK2 positive  Current Therapy:   Aspirin 81 mg p.o. daily Hydrea  500 mg p.o. daily*08/11/2023      Interim History:  Lori [name] is back for follow-up.  We last saw her back in October.  Since then, she been doing quite well.  She is incredibly busy at the [facility].  She deals with newborns and nutritional issues.  I am not surprised that she is quite busy.  She has been feeling well.  She is on Hydrea .  She has had no problems on Hydrea .  She has been on Hydrea  now probably for 6 months.  She has had no problems with bleeding.  She has her monthly cycles..  There is been no cough or shortness of breath.  She exercises all the time.  She has had no rashes.  Has been no ulcerations in her legs.  She has had no fever.  There has been no problems with The flu or COVID.  Overall, I would have to say that her performance status is probably ECOG 0.     Medications:  Current Outpatient Medications:    aspirin EC 81 MG tablet, Take 81 mg by mouth daily. Swallow whole., Disp: , Rfl:    hydroxyurea  (HYDREA ) 500 MG capsule, Take 1 capsule (500 mg total) by mouth daily. May take with food to minimize GI side effects., Disp: 30 capsule, Rfl: 5  Allergies: No Known Allergies  Past Medical History, Surgical history, Social history, and Family History were reviewed and updated.  Review of Systems: Review of Systems  Constitutional: Negative.   HENT:  Negative.    Eyes: Negative.   Respiratory: Negative.    Cardiovascular: Negative.   Gastrointestinal: Negative.   Endocrine: Negative.   Genitourinary: Negative.    Musculoskeletal: Negative.   Skin: Negative.   Neurological: Negative.   Hematological: Negative.   Psychiatric/Behavioral: Negative.      Physical Exam:  height is 5' 3 (1.6 m) and weight is 133 lb 11.2 oz (60.6  kg). Her oral temperature is 99.2 F (37.3 C). Her blood pressure is 121/82 and her pulse is 91. Her respiration is 20 and oxygen saturation is 100%.   Wt Readings from Last 3 Encounters:  02/08/24 133 lb 11.2 oz (60.6 kg)  11/09/23 133 lb 12.8 oz (60.7 kg)  09/14/23 136 lb (61.7 kg)    Physical Exam Vitals reviewed.  HENT:     Head: Normocephalic and atraumatic.  Eyes:     Pupils: Pupils are equal, round, and reactive to light.  Cardiovascular:     Rate and Rhythm: Normal rate and regular rhythm.     Heart sounds: Normal heart sounds.  Pulmonary:     Effort: Pulmonary effort is normal.     Breath sounds: Normal breath sounds.  Abdominal:     General: Bowel sounds are normal.     Palpations: Abdomen is soft.     Comments: Her spleen tip might be palpable with inspiration under the left costal margin.  Musculoskeletal:        General: No tenderness or deformity. Normal range of motion.     Cervical back: Normal range of motion.  Lymphadenopathy:     Cervical: No cervical adenopathy.  Skin:    General: Skin is warm and dry.     Findings: No erythema or rash.  Neurological:  Mental Status: She is alert and oriented to person, place, and time.  Psychiatric:        Behavior: Behavior normal.        Thought Content: Thought content normal.        Judgment: Judgment normal.      Lab Results  Component Value Date   WBC 8.0 02/08/2024   HGB 15.1 (H) 02/08/2024   HCT 42.6 02/08/2024   MCV 94.0 02/08/2024   PLT 536 (H) 02/08/2024     Chemistry      Component Value Date/Time   NA 140 11/09/2023 1455   K 4.2 11/09/2023 1455   CL 102 11/09/2023 1455   CO2 24 11/09/2023 1455   BUN 14 11/09/2023 1455   CREATININE 0.80 11/09/2023 1455   CREATININE 0.71 04/18/2020 1620      Component Value Date/Time   CALCIUM 9.4 11/09/2023 1455   ALKPHOS 69 11/09/2023 1455   AST 19 11/09/2023 1455   ALT 22 11/09/2023 1455   BILITOT 0.6 11/09/2023 1455       Impression and  Plan: Lori [name] is a very charming 29 year old female.  She has essential thrombocythemia.  She is JAK2 positive.  She has low risk disease.   She currently is on Hydrea  now.  Her platelet count is relatively stable.  So far, there is no need to make any changes in the Hydrea .  She is also on baby aspirin.  I would like to see her back in 3 months.  When she comes back, we will do an ultrasound of her spleen.  As always, it is fun talking with her.  She is incredibly eloquent.  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    Lori JONELLE Crease, MD 1/7/20263:42 PMI "

## 2024-02-13 ENCOUNTER — Other Ambulatory Visit: Payer: Self-pay | Admitting: *Deleted

## 2024-02-13 DIAGNOSIS — D473 Essential (hemorrhagic) thrombocythemia: Secondary | ICD-10-CM

## 2024-05-09 ENCOUNTER — Inpatient Hospital Stay: Admitting: Hematology & Oncology

## 2024-05-09 ENCOUNTER — Inpatient Hospital Stay: Attending: Hematology & Oncology

## 2024-05-09 ENCOUNTER — Other Ambulatory Visit (HOSPITAL_COMMUNITY)
